# Patient Record
Sex: Female | Born: 1970 | Race: White | Hispanic: No | Marital: Single | Smoking: Former smoker
Health system: Southern US, Community
[De-identification: ages and names within clinical notes are randomized; demographics above are authoritative.]

## PROBLEM LIST (undated history)

## (undated) DIAGNOSIS — B009 Herpesviral infection, unspecified: Secondary | ICD-10-CM

## (undated) DIAGNOSIS — K219 Gastro-esophageal reflux disease without esophagitis: Secondary | ICD-10-CM

## (undated) DIAGNOSIS — L309 Dermatitis, unspecified: Secondary | ICD-10-CM

## (undated) DIAGNOSIS — N809 Endometriosis, unspecified: Secondary | ICD-10-CM

## (undated) HISTORY — PX: HERNIA REPAIR: SHX51

## (undated) HISTORY — PX: BREAST ENHANCEMENT SURGERY: SHX7

## (undated) HISTORY — PX: OTHER SURGICAL HISTORY: SHX169

## (undated) HISTORY — DX: Gastro-esophageal reflux disease without esophagitis: K21.9

## (undated) HISTORY — DX: Endometriosis, unspecified: N80.9

## (undated) HISTORY — PX: BREAST BIOPSY: SHX20

---

## 1998-08-06 ENCOUNTER — Other Ambulatory Visit: Admission: RE | Admit: 1998-08-06 | Discharge: 1998-08-06 | Payer: Self-pay | Admitting: Obstetrics and Gynecology

## 1999-08-12 ENCOUNTER — Other Ambulatory Visit: Admission: RE | Admit: 1999-08-12 | Discharge: 1999-08-12 | Payer: Self-pay | Admitting: Obstetrics and Gynecology

## 2000-08-14 ENCOUNTER — Other Ambulatory Visit: Admission: RE | Admit: 2000-08-14 | Discharge: 2000-08-14 | Payer: Self-pay | Admitting: Obstetrics and Gynecology

## 2001-08-23 ENCOUNTER — Other Ambulatory Visit: Admission: RE | Admit: 2001-08-23 | Discharge: 2001-08-23 | Payer: Self-pay | Admitting: Obstetrics and Gynecology

## 2002-09-05 ENCOUNTER — Other Ambulatory Visit: Admission: RE | Admit: 2002-09-05 | Discharge: 2002-09-05 | Payer: Self-pay | Admitting: Obstetrics and Gynecology

## 2003-09-10 ENCOUNTER — Other Ambulatory Visit: Admission: RE | Admit: 2003-09-10 | Discharge: 2003-09-10 | Payer: Self-pay | Admitting: Obstetrics and Gynecology

## 2004-07-15 ENCOUNTER — Ambulatory Visit (HOSPITAL_BASED_OUTPATIENT_CLINIC_OR_DEPARTMENT_OTHER): Admission: RE | Admit: 2004-07-15 | Discharge: 2004-07-15 | Payer: Self-pay | Admitting: General Surgery

## 2004-07-15 ENCOUNTER — Ambulatory Visit (HOSPITAL_COMMUNITY): Admission: RE | Admit: 2004-07-15 | Discharge: 2004-07-15 | Payer: Self-pay | Admitting: General Surgery

## 2004-07-15 ENCOUNTER — Encounter (INDEPENDENT_AMBULATORY_CARE_PROVIDER_SITE_OTHER): Payer: Self-pay | Admitting: *Deleted

## 2004-10-12 ENCOUNTER — Other Ambulatory Visit: Admission: RE | Admit: 2004-10-12 | Discharge: 2004-10-12 | Payer: Self-pay | Admitting: Obstetrics and Gynecology

## 2005-10-20 ENCOUNTER — Other Ambulatory Visit: Admission: RE | Admit: 2005-10-20 | Discharge: 2005-10-20 | Payer: Self-pay | Admitting: Obstetrics and Gynecology

## 2006-08-10 ENCOUNTER — Emergency Department (HOSPITAL_COMMUNITY): Admission: EM | Admit: 2006-08-10 | Discharge: 2006-08-10 | Payer: Self-pay | Admitting: Family Medicine

## 2008-12-19 HISTORY — PX: AUGMENTATION MAMMAPLASTY: SUR837

## 2009-01-09 ENCOUNTER — Encounter: Admission: RE | Admit: 2009-01-09 | Discharge: 2009-01-09 | Payer: Self-pay | Admitting: Obstetrics and Gynecology

## 2010-05-27 ENCOUNTER — Ambulatory Visit: Payer: Self-pay | Admitting: Internal Medicine

## 2010-05-27 DIAGNOSIS — R0989 Other specified symptoms and signs involving the circulatory and respiratory systems: Secondary | ICD-10-CM

## 2010-05-27 DIAGNOSIS — R0609 Other forms of dyspnea: Secondary | ICD-10-CM

## 2010-05-27 DIAGNOSIS — K219 Gastro-esophageal reflux disease without esophagitis: Secondary | ICD-10-CM | POA: Insufficient documentation

## 2010-05-27 DIAGNOSIS — R079 Chest pain, unspecified: Secondary | ICD-10-CM | POA: Insufficient documentation

## 2010-05-28 ENCOUNTER — Telehealth (INDEPENDENT_AMBULATORY_CARE_PROVIDER_SITE_OTHER): Payer: Self-pay | Admitting: *Deleted

## 2011-01-20 NOTE — Assessment & Plan Note (Signed)
Summary: new to estab/cbs   Vital Signs:  Patient profile:   40 year old female Height:      63.5 inches Weight:      106 pounds BMI:     18.55 O2 Sat:      100 % Temp:     98.0 degrees F oral Pulse rate:   95 / minute Resp:     14 per minute BP sitting:   102 / 64  (left arm) Cuff size:   regular  Vitals Entered By: Shonna Chock (May 27, 2010 1:14 PM) CC: New to Establish- since October/November 2010 patient feels like something is going on in her chest-" Kinda like SOB"  Comments No current prescribed meds   CC:  New to Establish- since October/November 2010 patient feels like something is going on in her chest-" Kinda like SOB" .  History of Present Illness:        Ann Mckenzie is here to establish as a new patient;she has had dyspnea on exertion & "chest tightness"   for 6 months. She is concerned as her father, MGF , MGM  & 2 Muncles had lung cancer.All were smokers; she has smoked since 16, up to 1 ppd. She was  Rxed Chantix  by Dr Arelia Sneddon  which  did help some,but it caused constipation.       The patient reports resting chest pain as well as exertional chest pain, and shortness of breath("as if I can't get a deep enough breath"), but denies nausea, vomiting, diaphoresis, palpitations, dizziness, light headedness, syncope, and indigestion ( dyspepsia is separate  issue controlled by OTC Prilosec).  The pain is described as intermittent and pressure-like.  The pain is located in the L chest; the pain does not radiate.  Episodes of chest pain last 2-5 minutes.  The pain is brought on or made worse by strenuous activity.  Lipids were  WNL @ Dr Lisbeth Ply office.  Preventive Screening-Counseling & Management  Alcohol-Tobacco     Smoking Status: current  Caffeine-Diet-Exercise     Does Patient Exercise: yes  Allergies (verified): No Known Drug Allergies  Past History:  Past Medical History: Endometriosis GERD  Past Surgical History: Breast implants; Dental implant; G 1 P 1 ; C  section  Family History: Father: lung cancer Mother: neg Siblings: none; MGM , MGF, 2 M uncles lung cancer  Social History: Occupation:Administrative  Married Current Smoker: 1 ppd Alcohol use-yes: socially Regular exercise-yes: CVE( P90X ) 3X /week  for 45 min Smoking Status:  current Does Patient Exercise:  yes  Review of Systems General:  Denies fatigue and weight loss. ENT:  Denies difficulty swallowing, hoarseness, nasal congestion, and sinus pressure. CV:  Denies difficulty breathing at night, difficulty breathing while lying down, and leg cramps with exertion. Resp:  Denies chest pain with inspiration, coughing up blood, excessive snoring, hypersomnolence, morning headaches, pleuritic, and wheezing.  Physical Exam  General:  Thin but well-nourished,in no acute distress; alert,appropriate and cooperative throughout examination Eyes:  No corneal or conjunctival inflammation noted.Marland Kitchen Perrla.  Ears:  R ear normal.   Wax on L Nose:  External nasal examination shows no deformity or inflammation. Nasal mucosa are pink and moist without lesions or exudates. Mouth:  Oral mucosa and oropharynx without lesions or exudates.  Teeth in good repair. Neck:  No deformities, masses, or tenderness noted. Lungs:  Normal respiratory effort, chest expands symmetrically. Lungs are clear to auscultation, no crackles or wheezes. Heart:  Normal rate and regular rhythm.  S1 and S2 normal without gallop, murmur, click, rub .S4 with slurring Abdomen:  Bowel sounds positive,abdomen soft and non-tender without masses, organomegaly or hernias noted. Aortic bruit with slurring ; no AAA Pulses:  R and L carotid,radial  and posterior tibial pulses are full and equal bilaterally. Slightly decreased DPP Extremities:  No clubbing, cyanosis, edema. Neurologic:  alert & oriented X3 and DTRs symmetrical and normal.   Skin:  Intact without suspicious lesions or rashes. Tanned Cervical Nodes:  Minor shotty  LA Axillary Nodes:  No palpable lymphadenopathy Psych:  memory intact for recent and remote, normally interactive, good eye contact, not anxious appearing, and not depressed appearing.     Impression & Recommendations:  Problem # 1:  CHEST PAIN (ICD-786.50)  Orders: EKG w/ Interpretation (93000)  Problem # 2:  DYSPNEA/SHORTNESS OF BREATH (ICD-786.09)  Orders: T-2 View CXR (71020TC) Misc. Referral (Misc. Ref)  Problem # 3:  GERD (ICD-530.81)  Problem # 4:  LUNG CANCER, FAMILY HX (ICD-V16.1)  Patient Instructions: 1)  Please review the diagrams concerning genetic  lung cancer risk & Nicotine driven Dopamine release  as discussed. Consider all smoking cessation options as discussed.

## 2011-01-20 NOTE — Progress Notes (Signed)
Summary: xray results  Phone Note Call from Patient Call back at Work Phone [phone]   Caller: Patient Summary of Call: pt called wanted results of xray, --given and informed was mailed .Kandice Hams  May 28, 2010 3:04 PM  Initial call taken by: Kandice Hams,  May 28, 2010 3:04 PM

## 2011-02-16 ENCOUNTER — Other Ambulatory Visit: Payer: Self-pay | Admitting: Obstetrics and Gynecology

## 2011-02-16 DIAGNOSIS — R928 Other abnormal and inconclusive findings on diagnostic imaging of breast: Secondary | ICD-10-CM

## 2011-02-22 ENCOUNTER — Ambulatory Visit
Admission: RE | Admit: 2011-02-22 | Discharge: 2011-02-22 | Disposition: A | Discharge status: Home / Self Care | Payer: 59 | Source: Ambulatory Visit | Attending: Obstetrics and Gynecology | Admitting: Obstetrics and Gynecology

## 2011-02-22 ENCOUNTER — Other Ambulatory Visit: Payer: Self-pay | Admitting: Obstetrics and Gynecology

## 2011-02-22 DIAGNOSIS — R928 Other abnormal and inconclusive findings on diagnostic imaging of breast: Secondary | ICD-10-CM

## 2011-05-06 NOTE — Op Note (Signed)
Ann Mckenzie, Ann Mckenzie                          ACCOUNT NO.:  [id number]   MEDICAL RECORD NO.:  [id number]                   PATIENT TYPE:  AMB   LOCATION:  [facility]                                  FACILITY:  [facility]   PHYSICIAN:  Ollen Gross. Vernell Morgans, M.D.              DATE OF BIRTH:  1971/06/04   DATE OF PROCEDURE:  07/15/2004  DATE OF DISCHARGE:  07/15/2004                                 OPERATIVE REPORT   PREOPERATIVE DIAGNOSIS:  Right inguinal hernia.   POSTOPERATIVE DIAGNOSIS:  Right indirect inguinal hernia.   OPERATION PERFORMED:  Right inguinal hernia repair with mesh.   SURGEON:  Ollen Gross. Carolynne Edouard, M.D.   ANESTHESIA:  General via LMA.   DESCRIPTION OF PROCEDURE:  After informed consent was obtained, the patient  was brought to the operating room and placed in supine position on the  operating table.  After adequate induction of general anesthesia, the  patient's right abdomen and groin were prepped with Betadine and draped in  the usual sterile manner.  A small incision was made from the edge of the  pubic tubercle on the right towards the anterior __________ for a distance  of about 4 to 5 cm.  This incision was carried down through the skin and  subcutaneous tissue sharply with the electrocautery.  A small bridging vein  was encountered which was clamped with hemostats, divided and ligated with 3-  0 silk ties.  Dissection was then carried through the subcutaneous tissue  until the fascia of the external oblique was encountered.  This fascial  layer was opened along its fibers towards the apex of the external ring  sharply with the Metzenbaum scissors.  Care was taken to avoid the  ilioinguinal nerve.  The ilioinguinal nerve was very adherent to the cord  with some scar tissue and so the nerve was clamped with hemostats both  proximally and distally, divided and then ligated with 3-0 silk ties.  The  broad ligament structures and the inguinal canal were then gently  skeletonized.   A small hernia sac was identified in this ligamentous  structure and was gently teased away from the rest of this broad ligament by  combination of blunt hemostat dissection as well as sharp dissection with  electrocautery.  The sac was then opened and there were no visceral contents  within the sac.  The sac was ligated near its base with a 2-0 silk suture  ligature.  The distal portion of the sac was excised and sent to pathology  for identification and the proximal sac was allowed to retract back beneath  the muscular aponeurotic transversalis layer.  The rest of the broad  ligament structures were clamped proximally and distally with hemostats and  divided and ligated with 3-0 silk ties.  The patient was very thin and it  was decided to use a piece of Parietex polyester mesh so that she  would not  be able to feel the hard mesh through her very thin abdominal wall.  This  mesh was cut to fit to the floor of the inguinal canal.  The mesh was then  sewed inferiorly to the shelving edge of the inguinal ligament using a  running 3-0 Prolene stitch.  The mesh was then sewed superiorly to the  muscular aponeurotic fascial layer of the transversalis with 3-0 Prolene  interrupted vertical mattress stitches.  Once this was accomplished, the  mesh appeared to be in good position without any tension.  The wound was  then irrigated with copious amounts of saline.  The external oblique was  then reapproximated with 3-0 Vicryl stitch. The subcutaneous tissue was  irrigated with copious amounts of saline.  The subcutaneous fascia was then  closed with running 3-0 Vicryl stitch and the skin was  closed with a running 4-0 Monocryl subcuticular stitch.  Benzoin, Steri-  Strips and sterile dressings were applied.  The patient tolerated the  procedure well.  At the end of the case all sponge, needle and instrument  counts were correct.  The patient was awakened and taken to the recovery  room in stable  condition.                                               Ollen Gross. Vernell Morgans, M.D.    PST/MEDQ  D:  07/18/2004  T:  07/18/2004  Job:  [id number]

## 2012-07-18 ENCOUNTER — Other Ambulatory Visit (HOSPITAL_COMMUNITY): Payer: Self-pay | Admitting: Obstetrics and Gynecology

## 2012-07-18 DIAGNOSIS — K8020 Calculus of gallbladder without cholecystitis without obstruction: Secondary | ICD-10-CM

## 2012-07-18 DIAGNOSIS — R1011 Right upper quadrant pain: Secondary | ICD-10-CM

## 2012-07-23 ENCOUNTER — Ambulatory Visit (HOSPITAL_COMMUNITY)
Admission: RE | Admit: 2012-07-23 | Discharge: 2012-07-23 | Disposition: A | Discharge status: Home / Self Care | Payer: BC Managed Care – PPO | Source: Ambulatory Visit | Attending: Obstetrics and Gynecology | Admitting: Obstetrics and Gynecology

## 2012-07-23 DIAGNOSIS — R1011 Right upper quadrant pain: Secondary | ICD-10-CM | POA: Insufficient documentation

## 2012-07-23 DIAGNOSIS — K8020 Calculus of gallbladder without cholecystitis without obstruction: Secondary | ICD-10-CM

## 2012-07-31 ENCOUNTER — Encounter: Payer: Self-pay | Admitting: Gastroenterology

## 2012-08-02 ENCOUNTER — Ambulatory Visit: Payer: 59 | Admitting: Gastroenterology

## 2013-07-10 ENCOUNTER — Other Ambulatory Visit: Payer: Self-pay | Admitting: Obstetrics and Gynecology

## 2013-07-10 DIAGNOSIS — R928 Other abnormal and inconclusive findings on diagnostic imaging of breast: Secondary | ICD-10-CM

## 2013-07-24 ENCOUNTER — Ambulatory Visit
Admission: RE | Admit: 2013-07-24 | Discharge: 2013-07-24 | Disposition: A | Discharge status: Home / Self Care | Payer: BC Managed Care – PPO | Source: Ambulatory Visit | Attending: Obstetrics and Gynecology | Admitting: Obstetrics and Gynecology

## 2013-07-24 DIAGNOSIS — R928 Other abnormal and inconclusive findings on diagnostic imaging of breast: Secondary | ICD-10-CM

## 2017-09-05 ENCOUNTER — Other Ambulatory Visit: Payer: Self-pay | Admitting: Obstetrics and Gynecology

## 2017-09-05 DIAGNOSIS — R921 Mammographic calcification found on diagnostic imaging of breast: Secondary | ICD-10-CM

## 2017-09-15 ENCOUNTER — Ambulatory Visit
Admission: RE | Admit: 2017-09-15 | Discharge: 2017-09-15 | Disposition: A | Discharge status: Home / Self Care | Payer: BLUE CROSS/BLUE SHIELD | Source: Ambulatory Visit | Attending: Obstetrics and Gynecology | Admitting: Obstetrics and Gynecology

## 2017-09-15 ENCOUNTER — Other Ambulatory Visit: Payer: Self-pay | Admitting: Obstetrics and Gynecology

## 2017-09-15 DIAGNOSIS — R921 Mammographic calcification found on diagnostic imaging of breast: Secondary | ICD-10-CM

## 2017-09-22 ENCOUNTER — Other Ambulatory Visit: Payer: Self-pay | Admitting: Obstetrics and Gynecology

## 2017-09-22 ENCOUNTER — Ambulatory Visit
Admission: RE | Admit: 2017-09-22 | Discharge: 2017-09-22 | Disposition: A | Discharge status: Home / Self Care | Payer: BLUE CROSS/BLUE SHIELD | Source: Ambulatory Visit | Attending: Obstetrics and Gynecology | Admitting: Obstetrics and Gynecology

## 2017-09-22 DIAGNOSIS — R921 Mammographic calcification found on diagnostic imaging of breast: Secondary | ICD-10-CM

## 2017-10-02 HISTORY — PX: BREAST BIOPSY: SHX20

## 2018-11-30 ENCOUNTER — Other Ambulatory Visit: Payer: Self-pay | Admitting: Obstetrics and Gynecology

## 2018-11-30 DIAGNOSIS — Z1231 Encounter for screening mammogram for malignant neoplasm of breast: Secondary | ICD-10-CM

## 2018-12-19 DIAGNOSIS — Z923 Personal history of irradiation: Secondary | ICD-10-CM

## 2018-12-19 HISTORY — DX: Personal history of irradiation: Z92.3

## 2019-01-09 ENCOUNTER — Ambulatory Visit: Payer: BLUE CROSS/BLUE SHIELD

## 2019-02-07 ENCOUNTER — Ambulatory Visit
Admission: RE | Admit: 2019-02-07 | Discharge: 2019-02-07 | Disposition: A | Discharge status: Home / Self Care | Payer: BLUE CROSS/BLUE SHIELD | Source: Ambulatory Visit | Attending: Obstetrics and Gynecology | Admitting: Obstetrics and Gynecology

## 2019-02-07 DIAGNOSIS — Z1231 Encounter for screening mammogram for malignant neoplasm of breast: Secondary | ICD-10-CM

## 2019-02-08 ENCOUNTER — Other Ambulatory Visit: Payer: Self-pay | Admitting: Obstetrics and Gynecology

## 2019-02-08 DIAGNOSIS — N63 Unspecified lump in unspecified breast: Secondary | ICD-10-CM

## 2019-02-20 ENCOUNTER — Ambulatory Visit
Admission: RE | Admit: 2019-02-20 | Discharge: 2019-02-20 | Disposition: A | Discharge status: Home / Self Care | Payer: BLUE CROSS/BLUE SHIELD | Source: Ambulatory Visit | Attending: Obstetrics and Gynecology | Admitting: Obstetrics and Gynecology

## 2019-02-20 ENCOUNTER — Other Ambulatory Visit: Payer: BLUE CROSS/BLUE SHIELD

## 2019-02-20 ENCOUNTER — Other Ambulatory Visit: Payer: Self-pay | Admitting: Obstetrics and Gynecology

## 2019-02-20 DIAGNOSIS — N63 Unspecified lump in unspecified breast: Secondary | ICD-10-CM

## 2019-02-20 DIAGNOSIS — N6310 Unspecified lump in the right breast, unspecified quadrant: Secondary | ICD-10-CM

## 2019-02-22 ENCOUNTER — Ambulatory Visit
Admission: RE | Admit: 2019-02-22 | Discharge: 2019-02-22 | Disposition: A | Discharge status: Home / Self Care | Payer: BLUE CROSS/BLUE SHIELD | Source: Ambulatory Visit | Attending: Obstetrics and Gynecology | Admitting: Obstetrics and Gynecology

## 2019-02-22 DIAGNOSIS — N6310 Unspecified lump in the right breast, unspecified quadrant: Secondary | ICD-10-CM

## 2019-02-22 HISTORY — PX: BREAST BIOPSY: SHX20

## 2019-03-01 ENCOUNTER — Encounter (HOSPITAL_BASED_OUTPATIENT_CLINIC_OR_DEPARTMENT_OTHER): Payer: Self-pay | Admitting: *Deleted

## 2019-03-01 ENCOUNTER — Other Ambulatory Visit: Payer: Self-pay

## 2019-03-01 ENCOUNTER — Other Ambulatory Visit: Payer: Self-pay | Admitting: Surgery

## 2019-03-01 DIAGNOSIS — Z853 Personal history of malignant neoplasm of breast: Secondary | ICD-10-CM

## 2019-03-04 ENCOUNTER — Encounter: Payer: Self-pay | Admitting: Oncology

## 2019-03-05 NOTE — H&P (Signed)
Ann Mckenzie Documented: 03/01/2019 9:59 AM Location: [facility]ck[facility] Patient #: [id number] DOB: 11/28/1971 Single / Language: Ann Mckenzie / Race: White Female   History of Present Illness (Ann Bencosme A. Ninfa Linden MD; 03/01/2019 10:40 AM) The patient is a 48 year old female who presents with breast cancer. This is a 48 year old female referred by Dr. Arnetha Gula for evaluation of a newly diagnosed right breast cancer. The patient actually felt a small mass in the right breast back in either November or October. She has a history of bilateral breast implants underneath the muscle. She denied nipple discharge. She had no pain. She underwent mammograms and ultrasound showing a 1 cm mass in the right breast at the 11 o'clock position 2 cm from the nipple. Ultrasound of the axilla was normal. She has no family history of breast cancer. She is otherwise healthy without complaints. 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 Ki-67 was 2%   Past Surgical History Ann Mckenzie, CMA; 03/01/2019 9:59 AM) Breast Augmentation  Bilateral. Breast Biopsy  Right. Cesarean Section - 1  Oral Mckenzie   Diagnostic Studies History Ann Mckenzie, Ann Mckenzie; 03/01/2019 9:59 AM) Colonoscopy  never Mammogram  within last year Pap Smear  1-5 years ago  Allergies Ann Mckenzie, Ann Mckenzie; 03/01/2019 10:00 AM) No Known Drug Allergies [03/01/2019]:  Medication History Ann Mckenzie, Ann Mckenzie; 03/01/2019 10:00 AM) Medications Reconciled  Social History Ann Mckenzie, CMA; 03/01/2019 9:59 AM) Alcohol use  Moderate alcohol use. Caffeine use  Carbonated beverages, Coffee. No drug use  Tobacco use  Former smoker.  Family History Ann Mckenzie, Ann Mckenzie; 03/01/2019 9:59 AM) Alcohol Abuse  Father.  Pregnancy / Birth History Ann Mckenzie, Ann Mckenzie; 03/01/2019 9:59 AM) Age at menarche  50 years. Age of menopause   28-50 Contraceptive History  Intrauterine device. Gravida  1 Irregular periods  Length (months) of breastfeeding  3-6 Maternal age  76-30 Para  1  Other Problems Ann Mckenzie, Ann Mckenzie; 03/01/2019 9:59 AM) Breast Cancer     Review of Systems Ann Mckenzie CMA; 03/01/2019 9:59 AM) General Not Present- Appetite Loss, Chills, Fatigue, Fever, Night Sweats, Weight Gain and Weight Loss. Skin Not Present- Change in Wart/Mole, Dryness, Hives, Jaundice, New Lesions, Non-Healing Wounds, Rash and Ulcer. HEENT Not Present- Earache, Hearing Loss, Hoarseness, Nose Bleed, Oral Ulcers, Ringing in the Ears, Seasonal Allergies, Sinus Pain, Sore Throat, Visual Disturbances, Wears glasses/contact lenses and Yellow Eyes. Respiratory Not Present- Bloody sputum, Chronic Cough, Difficulty Breathing, Snoring and Wheezing. Breast Present- Breast Mass. Not Present- Breast Pain, Nipple Discharge and Skin Changes. Cardiovascular Not Present- Chest Pain, Difficulty Breathing Lying Down, Leg Cramps, Palpitations, Rapid Heart Rate, Shortness of Breath and Swelling of Extremities. Gastrointestinal Not Present- Abdominal Pain, Bloating, Bloody Stool, Change in Bowel Habits, Chronic diarrhea, Constipation, Difficulty Swallowing, Excessive gas, Gets full quickly at meals, Hemorrhoids, Indigestion, Nausea, Rectal Pain and Vomiting. Female Genitourinary Not Present- Frequency, Nocturia, Painful Urination, Pelvic Pain and Urgency. Musculoskeletal Not Present- Back Pain, Joint Pain, Joint Stiffness, Muscle Pain, Muscle Weakness and Swelling of Extremities. Neurological Not Present- Decreased Memory, Fainting, Headaches, Numbness, Seizures, Tingling, Tremor, Trouble walking and Weakness. Psychiatric Not Present- Anxiety, Bipolar, Change in Sleep Pattern, Depression, Fearful and Frequent crying. Endocrine Not Present- Cold Intolerance, Excessive Hunger, Hair Changes, Heat Intolerance, Hot flashes and New Diabetes. Hematology  Not Present- Blood Thinners, Easy Bruising, Excessive bleeding, Gland problems, HIV and Persistent Infections.  Vitals Ann Mckenzie CMA; 03/01/2019 9:59 AM) 03/01/2019 9:59 AM Weight:  100.2 lb Height: 64in Body Surface Area: 1.46 m Body Mass Index: 17.2 kg/m  Temp.: 98.29F  Pulse: 81 (Regular)  BP: 106/74 (Sitting, Left Arm, Standard)       Physical Exam (Ann Sem A. Ninfa Linden MD; 03/01/2019 10:39 AM) General Mental Status-Alert. General Appearance-Consistent with stated age. Hydration-Well hydrated. Voice-Normal.  Head and Neck Head-normocephalic, atraumatic with no lesions or palpable masses. Trachea-midline. Thyroid Gland Characteristics - normal size and consistency.  Eye Eyeball - Bilateral-Extraocular movements intact. Sclera/Conjunctiva - Bilateral-No scleral icterus.  Chest and Lung Exam Chest and lung exam reveals -quiet, even and easy respiratory effort with no use of accessory muscles and on auscultation, normal breath sounds, no adventitious sounds and normal vocal resonance. Inspection Chest Wall - Normal. Back - normal.  Breast Breast - Left-Symmetric, Non Tender, No Biopsy scars, no Dimpling, No Inflammation, No Lumpectomy scars, No Mastectomy scars, No Peau d' Orange. Breast - Right-Symmetric, Non Tender, No Biopsy scars, no Dimpling, No Inflammation, No Lumpectomy scars, No Mastectomy scars, No Peau d' Orange. Note: There is a small mass palpable at the 11 o'clock position of the right breast near the areola. It feels fairly superficial. There is no axillary adenopathy on either side and no other breast masses.  Cardiovascular Cardiovascular examination reveals -normal heart sounds, regular rate and rhythm with no murmurs and normal pedal pulses bilaterally.  Abdomen Inspection Inspection of the abdomen reveals - No Hernias. Skin - Scar - no surgical scars. Palpation/Percussion Palpation and Percussion of the  abdomen reveal - Soft, Non Tender, No Rebound tenderness, No Rigidity (guarding) and No hepatosplenomegaly. Auscultation Auscultation of the abdomen reveals - Bowel sounds normal.  Neurologic - Did not examine.  Musculoskeletal - Did not examine.  Lymphatic Head & Neck  General Head & Neck Lymphatics: Bilateral - Description - Normal. Axillary  General Axillary Region: Bilateral - Description - Normal. Tenderness - Non Tender. Femoral & Inguinal - Did not examine.    Assessment & Plan (Uriel Horkey A. 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 I discussed the diagnosis with the patient and her husband. We reviewed the mammograms, ultrasound, and pathology results. I gave her a copy of the pathology. We discussed breast cancer in detail. We discussed breast conservation versus mastectomy. We discussed postoperative radiation. We discussed sentinel lymph node biopsies, etc. After long discussion, she wished to proceed with a right breast partial mastectomy and sentinel lymph node biopsy. I discussed the risks of Mckenzie which includes but is not limited to bleeding, infection, injury to the implant, the need for further Mckenzie if margins are nodes are positive, cardiopulmonary issues, postoperative recovery, etc. We will refer her to medical and radiation oncology for further evaluation as well.

## 2019-03-05 NOTE — Progress Notes (Addendum)
Ensure pre surgery drink given with instructions to complete by 0530 dos, pt verbalized understanding. 

## 2019-03-06 ENCOUNTER — Other Ambulatory Visit: Payer: Self-pay

## 2019-03-06 ENCOUNTER — Ambulatory Visit (HOSPITAL_BASED_OUTPATIENT_CLINIC_OR_DEPARTMENT_OTHER): Payer: BLUE CROSS/BLUE SHIELD | Admitting: Anesthesiology

## 2019-03-06 ENCOUNTER — Ambulatory Visit (HOSPITAL_COMMUNITY)
Admission: RE | Admit: 2019-03-06 | Discharge: 2019-03-06 | Disposition: A | Discharge status: Home / Self Care | Payer: BLUE CROSS/BLUE SHIELD | Source: Ambulatory Visit | Attending: Surgery | Admitting: Surgery

## 2019-03-06 ENCOUNTER — Encounter (HOSPITAL_BASED_OUTPATIENT_CLINIC_OR_DEPARTMENT_OTHER): Payer: Self-pay | Admitting: *Deleted

## 2019-03-06 ENCOUNTER — Encounter (HOSPITAL_BASED_OUTPATIENT_CLINIC_OR_DEPARTMENT_OTHER)
Admission: RE | Disposition: A | Discharge status: Home / Self Care | Payer: Self-pay | Source: Home / Self Care | Attending: Surgery

## 2019-03-06 ENCOUNTER — Ambulatory Visit (HOSPITAL_BASED_OUTPATIENT_CLINIC_OR_DEPARTMENT_OTHER)
Admission: RE | Admit: 2019-03-06 | Discharge: 2019-03-06 | Disposition: A | Discharge status: Home / Self Care | Payer: BLUE CROSS/BLUE SHIELD | Attending: Surgery | Admitting: Surgery

## 2019-03-06 DIAGNOSIS — Z170 Estrogen receptor positive status [ER+]: Secondary | ICD-10-CM | POA: Insufficient documentation

## 2019-03-06 DIAGNOSIS — Z87891 Personal history of nicotine dependence: Secondary | ICD-10-CM | POA: Diagnosis not present

## 2019-03-06 DIAGNOSIS — C50411 Malignant neoplasm of upper-outer quadrant of right female breast: Secondary | ICD-10-CM | POA: Diagnosis present

## 2019-03-06 DIAGNOSIS — Z853 Personal history of malignant neoplasm of breast: Secondary | ICD-10-CM

## 2019-03-06 DIAGNOSIS — C50919 Malignant neoplasm of unspecified site of unspecified female breast: Secondary | ICD-10-CM

## 2019-03-06 HISTORY — DX: Malignant neoplasm of unspecified site of unspecified female breast: C50.919

## 2019-03-06 HISTORY — PX: PARTIAL MASTECTOMY WITH AXILLARY SENTINEL LYMPH NODE BIOPSY: SHX6004

## 2019-03-06 HISTORY — DX: Herpesviral infection, unspecified: B00.9

## 2019-03-06 HISTORY — DX: Dermatitis, unspecified: L30.9

## 2019-03-06 HISTORY — PX: BREAST LUMPECTOMY: SHX2

## 2019-03-06 SURGERY — PARTIAL MASTECTOMY WITH AXILLARY SENTINEL LYMPH NODE BIOPSY
Anesthesia: General | Site: Breast | Laterality: Right

## 2019-03-06 MED ORDER — CHLORHEXIDINE GLUCONATE CLOTH 2 % EX PADS: 6.0000 | MEDICATED_PAD | Freq: Once | CUTANEOUS | Status: DC

## 2019-03-06 MED ORDER — PROMETHAZINE HCL 25 MG/ML IJ SOLN: 6.2500 mg | INTRAMUSCULAR | Status: DC | PRN

## 2019-03-06 MED ORDER — ACETAMINOPHEN 500 MG PO TABS
1000.0000 mg | ORAL_TABLET | ORAL | Status: AC
  Administered 2019-03-06: 1000 mg via ORAL

## 2019-03-06 MED ORDER — MIDAZOLAM HCL 2 MG/2ML IJ SOLN
1.0000 mg | INTRAMUSCULAR | Status: DC | PRN
  Administered 2019-03-06: 2 mg via INTRAVENOUS

## 2019-03-06 MED ORDER — SCOPOLAMINE 1 MG/3DAYS TD PT72
MEDICATED_PATCH | TRANSDERMAL | Status: AC
  Filled 2019-03-06: qty 1

## 2019-03-06 MED ORDER — ACETAMINOPHEN 10 MG/ML IV SOLN: 1000.0000 mg | Freq: Once | INTRAVENOUS | Status: DC | PRN

## 2019-03-06 MED ORDER — LIDOCAINE HCL (CARDIAC) PF 100 MG/5ML IV SOSY
PREFILLED_SYRINGE | INTRAVENOUS | Status: DC | PRN
  Administered 2019-03-06: 60 mg via INTRAVENOUS

## 2019-03-06 MED ORDER — ONDANSETRON HCL 4 MG/2ML IJ SOLN
INTRAMUSCULAR | Status: DC | PRN
  Administered 2019-03-06: 4 mg via INTRAVENOUS

## 2019-03-06 MED ORDER — CEFAZOLIN SODIUM-DEXTROSE 2-4 GM/100ML-% IV SOLN
2.0000 g | INTRAVENOUS | Status: AC
  Administered 2019-03-06: 2 g via INTRAVENOUS

## 2019-03-06 MED ORDER — LACTATED RINGERS IV SOLN: INTRAVENOUS | Status: DC

## 2019-03-06 MED ORDER — MIDAZOLAM HCL 2 MG/2ML IJ SOLN
INTRAMUSCULAR | Status: AC
  Filled 2019-03-06: qty 2

## 2019-03-06 MED ORDER — MEPERIDINE HCL 25 MG/ML IJ SOLN: 6.2500 mg | INTRAMUSCULAR | Status: DC | PRN

## 2019-03-06 MED ORDER — FENTANYL CITRATE (PF) 100 MCG/2ML IJ SOLN
50.0000 ug | INTRAMUSCULAR | Status: DC | PRN
  Administered 2019-03-06: 100 ug via INTRAVENOUS

## 2019-03-06 MED ORDER — GABAPENTIN 300 MG PO CAPS
300.0000 mg | ORAL_CAPSULE | ORAL | Status: AC
  Administered 2019-03-06: 300 mg via ORAL

## 2019-03-06 MED ORDER — ACETAMINOPHEN 500 MG PO TABS
ORAL_TABLET | ORAL | Status: AC
  Filled 2019-03-06: qty 2

## 2019-03-06 MED ORDER — BUPIVACAINE-EPINEPHRINE 0.25% -1:200000 IJ SOLN
INTRAMUSCULAR | Status: DC | PRN
  Administered 2019-03-06: 10 mL

## 2019-03-06 MED ORDER — FENTANYL CITRATE (PF) 100 MCG/2ML IJ SOLN
INTRAMUSCULAR | Status: AC
  Filled 2019-03-06: qty 2

## 2019-03-06 MED ORDER — CEFAZOLIN SODIUM-DEXTROSE 2-4 GM/100ML-% IV SOLN
INTRAVENOUS | Status: AC
  Filled 2019-03-06: qty 100

## 2019-03-06 MED ORDER — ACETAMINOPHEN 160 MG/5ML PO SOLN: 325.0000 mg | Freq: Once | ORAL | Status: DC

## 2019-03-06 MED ORDER — LACTATED RINGERS IV SOLN
INTRAVENOUS | Status: DC
  Administered 2019-03-06: 08:00:00 via INTRAVENOUS

## 2019-03-06 MED ORDER — SCOPOLAMINE 1 MG/3DAYS TD PT72
1.0000 | MEDICATED_PATCH | Freq: Once | TRANSDERMAL | Status: DC | PRN
  Administered 2019-03-06: 1.5 mg via TRANSDERMAL

## 2019-03-06 MED ORDER — OXYCODONE HCL 5 MG PO TABS: 5.0000 mg | ORAL_TABLET | Freq: Four times a day (QID) | ORAL | 0 refills | Status: DC | PRN

## 2019-03-06 MED ORDER — TECHNETIUM TC 99M SULFUR COLLOID FILTERED
1.0000 | Freq: Once | INTRAVENOUS | Status: AC | PRN
  Administered 2019-03-06: 1 via INTRADERMAL

## 2019-03-06 MED ORDER — OXYCODONE HCL 5 MG/5ML PO SOLN: 5.0000 mg | Freq: Once | ORAL | Status: DC | PRN

## 2019-03-06 MED ORDER — PROPOFOL 10 MG/ML IV BOLUS
INTRAVENOUS | Status: DC | PRN
  Administered 2019-03-06: 200 mg via INTRAVENOUS

## 2019-03-06 MED ORDER — BUPIVACAINE-EPINEPHRINE (PF) 0.5% -1:200000 IJ SOLN
INTRAMUSCULAR | Status: DC | PRN
  Administered 2019-03-06: 25 mL

## 2019-03-06 MED ORDER — OXYCODONE HCL 5 MG PO TABS: 5.0000 mg | ORAL_TABLET | Freq: Once | ORAL | Status: DC | PRN

## 2019-03-06 MED ORDER — DEXAMETHASONE SODIUM PHOSPHATE 4 MG/ML IJ SOLN
INTRAMUSCULAR | Status: DC | PRN
  Administered 2019-03-06: 10 mg via INTRAVENOUS

## 2019-03-06 MED ORDER — GABAPENTIN 300 MG PO CAPS
ORAL_CAPSULE | ORAL | Status: AC
  Filled 2019-03-06: qty 1

## 2019-03-06 MED ORDER — FENTANYL CITRATE (PF) 100 MCG/2ML IJ SOLN: 25.0000 ug | INTRAMUSCULAR | Status: DC | PRN

## 2019-03-06 MED ORDER — CELECOXIB 200 MG PO CAPS
ORAL_CAPSULE | ORAL | Status: AC
  Filled 2019-03-06: qty 1

## 2019-03-06 MED ORDER — CELECOXIB 200 MG PO CAPS
200.0000 mg | ORAL_CAPSULE | ORAL | Status: AC
  Administered 2019-03-06: 200 mg via ORAL

## 2019-03-06 MED ORDER — EPHEDRINE SULFATE 50 MG/ML IJ SOLN
INTRAMUSCULAR | Status: DC | PRN
  Administered 2019-03-06: 10 mg via INTRAVENOUS

## 2019-03-06 MED ORDER — ACETAMINOPHEN 325 MG PO TABS: 325.0000 mg | ORAL_TABLET | Freq: Once | ORAL | Status: DC

## 2019-03-06 SURGICAL SUPPLY — 61 items
ADH SKN CLS APL DERMABOND .7 (GAUZE/BANDAGES/DRESSINGS) ×1
APL PRP STRL LF DISP 70% ISPRP (MISCELLANEOUS) ×1
APPLIER CLIP 9.375 MED OPEN (MISCELLANEOUS) ×3
APR CLP MED 9.3 20 MLT OPN (MISCELLANEOUS) ×1
BINDER BREAST MEDIUM (GAUZE/BANDAGES/DRESSINGS) ×2 IMPLANT
BLADE CLIPPER SURG (BLADE) IMPLANT
BLADE HEX COATED 2.75 (ELECTRODE) ×3 IMPLANT
BLADE SURG 15 STRL LF DISP TIS (BLADE) ×2 IMPLANT
BLADE SURG 15 STRL SS (BLADE) ×3
CANISTER SUCT 1200ML W/VALVE (MISCELLANEOUS) IMPLANT
CHLORAPREP W/TINT 26 (MISCELLANEOUS) ×3 IMPLANT
CLIP APPLIE 9.375 MED OPEN (MISCELLANEOUS) IMPLANT
COVER BACK TABLE 60X90IN (DRAPES) ×4 IMPLANT
COVER MAYO STAND STRL (DRAPES) ×3 IMPLANT
COVER PROBE W GEL 5X96 (DRAPES) ×3 IMPLANT
COVER WAND RF STERILE (DRAPES) IMPLANT
DECANTER SPIKE VIAL GLASS SM (MISCELLANEOUS) IMPLANT
DERMABOND ADVANCED (GAUZE/BANDAGES/DRESSINGS) ×2
DERMABOND ADVANCED .7 DNX12 (GAUZE/BANDAGES/DRESSINGS) ×1 IMPLANT
DRAIN CHANNEL 19F RND (DRAIN) IMPLANT
DRAIN HEMOVAC 1/8 X 5 (WOUND CARE) IMPLANT
DRAPE LAPAROSCOPIC ABDOMINAL (DRAPES) ×3 IMPLANT
DRAPE UTILITY XL STRL (DRAPES) ×3 IMPLANT
ELECT REM PT RETURN 9FT ADLT (ELECTROSURGICAL) ×3
ELECTRODE REM PT RTRN 9FT ADLT (ELECTROSURGICAL) ×1 IMPLANT
EVACUATOR SILICONE 100CC (DRAIN) IMPLANT
GAUZE SPONGE 4X4 12PLY STRL LF (GAUZE/BANDAGES/DRESSINGS) IMPLANT
GLOVE BIOGEL PI IND STRL 7.0 (GLOVE) IMPLANT
GLOVE BIOGEL PI IND STRL 8 (GLOVE) IMPLANT
GLOVE BIOGEL PI INDICATOR 7.0 (GLOVE) ×2
GLOVE BIOGEL PI INDICATOR 8 (GLOVE) ×2
GLOVE SURG SIGNA 7.5 PF LTX (GLOVE) ×3 IMPLANT
GLOVE SURG SS PI 7.0 STRL IVOR (GLOVE) ×2 IMPLANT
GOWN STRL REUS W/ TWL LRG LVL3 (GOWN DISPOSABLE) ×1 IMPLANT
GOWN STRL REUS W/ TWL XL LVL3 (GOWN DISPOSABLE) ×1 IMPLANT
GOWN STRL REUS W/TWL LRG LVL3 (GOWN DISPOSABLE) ×3
GOWN STRL REUS W/TWL XL LVL3 (GOWN DISPOSABLE) ×3
HEMOSTAT SURGICEL 2X14 (HEMOSTASIS) IMPLANT
KIT MARKER MARGIN INK (KITS) ×3 IMPLANT
NDL HYPO 25X1 1.5 SAFETY (NEEDLE) ×2 IMPLANT
NDL SAFETY ECLIPSE 18X1.5 (NEEDLE) ×1 IMPLANT
NEEDLE HYPO 18GX1.5 SHARP (NEEDLE)
NEEDLE HYPO 25X1 1.5 SAFETY (NEEDLE) ×3 IMPLANT
NS IRRIG 1000ML POUR BTL (IV SOLUTION) ×2 IMPLANT
PACK BASIN DAY SURGERY FS (CUSTOM PROCEDURE TRAY) ×3 IMPLANT
PENCIL BUTTON HOLSTER BLD 10FT (ELECTRODE) ×3 IMPLANT
PIN SAFETY STERILE (MISCELLANEOUS) IMPLANT
SLEEVE SCD COMPRESS KNEE MED (MISCELLANEOUS) ×3 IMPLANT
SPONGE LAP 18X18 RF (DISPOSABLE) IMPLANT
SPONGE LAP 4X18 RFD (DISPOSABLE) ×3 IMPLANT
SUT ETHILON 3 0 PS 1 (SUTURE) IMPLANT
SUT MNCRL AB 4-0 PS2 18 (SUTURE) ×4 IMPLANT
SUT VIC AB 3-0 SH 27 (SUTURE) ×3
SUT VIC AB 3-0 SH 27X BRD (SUTURE) ×1 IMPLANT
SYR BULB 3OZ (MISCELLANEOUS) IMPLANT
SYR CONTROL 10ML LL (SYRINGE) ×4 IMPLANT
TOWEL GREEN STERILE FF (TOWEL DISPOSABLE) ×3 IMPLANT
TRAY FAXITRON CT DISP (TRAY / TRAY PROCEDURE) IMPLANT
TUBE CONNECTING 20'X1/4 (TUBING) ×1
TUBE CONNECTING 20X1/4 (TUBING) ×2 IMPLANT
YANKAUER SUCT BULB TIP NO VENT (SUCTIONS) ×2 IMPLANT

## 2019-03-06 NOTE — Anesthesia Procedure Notes (Addendum)
Procedure Name: LMA Insertion Date/Time: 03/06/2019 9:41 AM Performed by: Maryella Shivers, CRNA Pre-anesthesia Checklist: Patient identified, Emergency Drugs available, Suction available and Patient being monitored Patient Re-evaluated:Patient Re-evaluated prior to induction Oxygen Delivery Method: Circle system utilized Preoxygenation: Pre-oxygenation with 100% oxygen Induction Type: IV induction Ventilation: Mask ventilation without difficulty LMA: LMA inserted LMA Size: 3.0 Number of attempts: 1 Airway Equipment and Method: Bite block Placement Confirmation: positive ETCO2 Tube secured with: Tape Dental Injury: Teeth and Oropharynx as per pre-operative assessment

## 2019-03-06 NOTE — Progress Notes (Signed)
Assisted Dr. Kierah Goatley Robert with right, ultrasound guided, pectoralis block. Side rails up, monitors on throughout procedure. See vital signs in flow sheet. Tolerated Procedure well.

## 2019-03-06 NOTE — Anesthesia Postprocedure Evaluation (Signed)
Anesthesia Post Note  Patient: Ann Mckenzie  Procedure(s) Performed: RIGHT BREAST PARTIAL MASTECTOMY WITH SENTINEL LYMPH NODE BIOPSY (Right Breast)     Patient location during evaluation: PACU Anesthesia Type: General Level of consciousness: awake and alert Pain management: pain level controlled Vital Signs Assessment: post-procedure vital signs reviewed and stable Respiratory status: spontaneous breathing, nonlabored ventilation, respiratory function stable and patient connected to nasal cannula oxygen Cardiovascular status: blood pressure returned to baseline and stable Postop Assessment: no apparent nausea or vomiting Anesthetic complications: no    Last Vitals:  Vitals:   03/06/19 1043 03/06/19 1045  BP: 110/74 111/73  Pulse: 83 80  Resp: 10 13  Temp: 36.5 C   SpO2: 100% 100%    Last Pain:  Vitals:   03/06/19 1043  TempSrc:   PainSc: 0-No pain                 Effie Berkshire

## 2019-03-06 NOTE — Interval H&P Note (Signed)
History and Physical Interval Note:no change in H and P  03/06/2019 7:51 AM  Ann Mckenzie  has presented today for surgery, with the diagnosis of RIGHT BREAST CANCER.  The various methods of treatment have been discussed with the patient and family. After consideration of risks, benefits and other options for treatment, the patient has consented to  Procedure(s): RIGHT BREAST PARTIAL MASTECTOMY WITH SENTINEL LYMPH NODE BIOPSY (Right) as a surgical intervention.  The patient's history has been reviewed, patient examined, no change in status, stable for surgery.  I have reviewed the patient's chart and labs.  Questions were answered to the patient's satisfaction.     Coralie Keens

## 2019-03-06 NOTE — Anesthesia Procedure Notes (Signed)
Anesthesia Regional Block: Pectoralis block   Pre-Anesthetic Checklist: ,, timeout performed, Correct Patient, Correct Site, Correct Laterality, Correct Procedure, Correct Position, site marked, Risks and benefits discussed,  Surgical consent,  Pre-op evaluation,  At surgeon's request and post-op pain management  Laterality: Right  Prep: chloraprep       Needles:  Injection technique: Single-shot  Needle Type: Echogenic Stimulator Needle     Needle Length: 9cm  Needle Gauge: 21     Additional Needles:   Procedures:,,,, ultrasound used (permanent image in chart),,,,  Narrative:  Start time: 03/06/2019 8:55 AM End time: 03/06/2019 9:03 AM Injection made incrementally with aspirations every 5 mL.  Performed by: Personally  Anesthesiologist: Effie Berkshire, MD  Additional Notes: Patient tolerated the procedure well. Local anesthetic introduced in an incremental fashion under minimal resistance after negative aspirations. No paresthesias were elicited. After completion of the procedure, no acute issues were identified and patient continued to be monitored by RN.

## 2019-03-06 NOTE — Op Note (Signed)
RIGHT BREAST PARTIAL MASTECTOMY WITH DEEP RIGHT AXILLARY SENTINEL LYMPH NODE BIOPSY  Procedure Note  Ann Mckenzie 03/06/2019   Pre-op Diagnosis: RIGHT BREAST CANCER     Post-op Diagnosis: same  Procedure(s): RIGHT BREAST PARTIAL MASTECTOMY WITH DEEP RIGHT AXILLARY SENTINEL LYMPH NODE BIOPSY  Surgeon(s): Coralie Keens, MD  Anesthesia: General  Staff:  Circulator: Humberto Seals, RN Relief Circulator: Renae Gloss, RN  Estimated Blood Loss: Minimal               Specimens: sent to path  Indications: This is a 48 year old female with a recently diagnosed right breast cancer.  She had a palpable mass at the 11 o'clock position of the right breast.  Stereotactic biopsy was performed showing an invasive ductal carcinoma which was 100% ER and PR positive.  The decision was made to proceed to the operating room for a partial mastectomy and sentinel lymph node biopsy  Procedure: The patient had the radioactive isotope injected by the radiation technologist into the right breast around the areola in the holding area.  She was then taken to the operating room.  She is placed upon on the operating table and general anesthesia was induced.  Her breast and axilla on the right side were then prepped and draped in usual sterile fashion.  I anesthetized the skin at the upper outer edge of the areola with Marcaine.  I then made a circumareolar incision with a scalpel.  I then dissected down to the breast tissue with electrocautery.  The patient was very thin and had a submuscular breast implant.  She had very little breast tissue.  I excised the palpable mass in its entirety in the upper outer quadrant going down to the underlying muscle taking it off of the pectoralis fascia.  Once the mass was completely removed, I marked all margins with paint and then sent to pathology for evaluation.  Hemostasis was achieved with the cautery.  Next the neoprobe was used to identify an area of increased uptake  in the right axilla.  I anesthetized skin and made a small incision with a scalpel.  I then dissected in the deep axillary tissue.  Identified a group of clustered lymph nodes which I grasped with an Allis clamp and excised with the cautery.  These were confirmed to be the sentinel lymph nodes with the neoprobe.  I then evaluated the nodal basin and found no other increased uptake of radioactive isotope.  No enlarged palpable nodes were identified.  At this point I anesthetized the incisions further.  I placed surgical clips at the biopsy cavity on the breast.  I then closed the subcutaneous tissue with interrupted 3-0 Vicryl sutures and closed skin with running 4-0 Monocryl.  Dermabond was then applied.  The patient did have a small area of burn to the nipple which was covered with Neosporin.  Gauze and a binder were then applied.  The patient tolerated the procedure well.  All the counts were correct at the end of the procedure.  The patient was then extubated in the operating room and taken in a stable condition to the recovery room.          Coralie Keens   Date: 03/06/2019  Time: 10:36 AM

## 2019-03-06 NOTE — Transfer of Care (Signed)
Immediate Anesthesia Transfer of Care Note  Patient: Ann Mckenzie  Procedure(s) Performed: RIGHT BREAST PARTIAL MASTECTOMY WITH SENTINEL LYMPH NODE BIOPSY (Right Breast)  Patient Location: PACU  Anesthesia Type:GA combined with regional for post-op pain  Level of Consciousness: awake, alert  and oriented  Airway & Oxygen Therapy: Patient Spontanous Breathing and Patient connected to face mask oxygen  Post-op Assessment: Report given to RN and Post -op Vital signs reviewed and stable  Post vital signs: Reviewed and stable  Last Vitals:  Vitals Value Taken Time  BP 110/74 03/06/2019 10:43 AM  Temp    Pulse 86 03/06/2019 10:44 AM  Resp 16 03/06/2019 10:44 AM  SpO2 100 % 03/06/2019 10:44 AM  Vitals shown include unvalidated device data.  Last Pain:  Vitals:   03/06/19 0745  TempSrc: Oral  PainSc: 0-No pain      Patients Stated Pain Goal: 2 (66/06/00 4599)  Complications: No apparent anesthesia complications

## 2019-03-06 NOTE — Anesthesia Preprocedure Evaluation (Addendum)
Anesthesia Evaluation  Patient identified by MRN, date of birth, ID band Patient awake    Reviewed: Allergy & Precautions, NPO status , Patient's Chart, lab work & pertinent test results  Airway Mallampati: I  TM Distance: >3 FB Neck ROM: Full    Dental  (+) Teeth Intact, Dental Advisory Given   Pulmonary former smoker,    breath sounds clear to auscultation       Cardiovascular negative cardio ROS   Rhythm:Regular Rate:Normal     Neuro/Psych negative neurological ROS  negative psych ROS   GI/Hepatic Neg liver ROS, GERD  ,  Endo/Other  negative endocrine ROS  Renal/GU negative Renal ROS     Musculoskeletal negative musculoskeletal ROS (+)   Abdominal Normal abdominal exam  (+)   Peds  Hematology negative hematology ROS (+)   Anesthesia Other Findings   Reproductive/Obstetrics                            Anesthesia Physical Anesthesia Plan  ASA: II  Anesthesia Plan: General   Post-op Pain Management: GA combined w/ Regional for post-op pain   Induction: Intravenous  PONV Risk Score and Plan: 4 or greater and Ondansetron, Dexamethasone, Midazolam and Scopolamine patch - Pre-op  Airway Management Planned: LMA  Additional Equipment: None  Intra-op Plan:   Post-operative Plan: Extubation in OR  Informed Consent: I have reviewed the patients History and Physical, chart, labs and discussed the procedure including the risks, benefits and alternatives for the proposed anesthesia with the patient or authorized representative who has indicated his/her understanding and acceptance.     Dental advisory given  Plan Discussed with: CRNA  Anesthesia Plan Comments:        Anesthesia Quick Evaluation

## 2019-03-06 NOTE — Discharge Instructions (Signed)
[facility] Office Phone Number [phone]  BREAST BIOPSY/ PARTIAL LUMPECTOMY: POST OP INSTRUCTIONS  Always review your discharge instruction sheet given to you by the facility where your surgery was performed.  IF YOU HAVE DISABILITY OR FAMILY LEAVE FORMS, YOU MUST BRING THEM TO THE OFFICE FOR PROCESSING.  DO NOT GIVE THEM TO YOUR DOCTOR.  1. A prescription for pain medication may be given to you upon discharge.  Take your pain medication as prescribed, if needed.  If narcotic pain medicine is not needed, then you may take acetaminophen (Tylenol) or ibuprofen (Advil) as needed. 1,000mg  Tylenol & 200mg  Celebrex given at 0758am. No Tylenol or ibuprofen until 2:00pm if needed 2. Take your usually prescribed medications unless otherwise directed 3. If you need a refill on your pain medication, please contact your pharmacy.  They will contact our office to request authorization.  Prescriptions will not be filled after 5pm or on week-ends. 4. You should eat very light the first 24 hours after surgery, such as soup, crackers, pudding, etc.  Resume your normal diet the day after surgery. 5. Most patients will experience some swelling and bruising in the breast.  Ice packs and a good support bra will help.  Swelling and bruising can take several days to resolve.  6. It is common to experience some constipation if taking pain medication after surgery.  Increasing fluid intake and taking a stool softener will usually help or prevent this problem from occurring.  A mild laxative (Milk of Magnesia or Miralax) should be taken according to package directions if there are no bowel movements after 48 hours. 7. Unless discharge instructions indicate otherwise, you may remove your bandages 24-48 hours after surgery, and you may shower at that time.  You may have steri-strips (small skin tapes) in place directly over the incision.  These strips should be left on the skin for 7-10 days.  If your surgeon  used skin glue on the incision, you may shower in 24 hours.  The glue will flake off over the next 2-3 weeks.  Any sutures or staples will be removed at the office during your follow-up visit. 8. ACTIVITIES:  You may resume regular daily activities (gradually increasing) beginning the next day.  Wearing a good support bra or sports bra minimizes pain and swelling.  You may have sexual intercourse when it is comfortable. a. You may drive when you no longer are taking prescription pain medication, you can comfortably wear a seatbelt, and you can safely maneuver your car and apply brakes. b. RETURN TO WORK:  ______________________________________________________________________________________ 9. You should see your doctor in the office for a follow-up appointment approximately two weeks after your surgery.  Your doctors nurse will typically make your follow-up appointment when she calls you with your pathology report.  Expect your pathology report 2-3 business days after your surgery.  You may call to check if you do not hear from [place] after three days. 10. OTHER INSTRUCTIONS:OK TO SHOWER STARTING TOMORROW 11. ICE PACK, TYLENOL, AND IBUPROFEN ALSO FOR PAIN 12. NO VIGOROUS ACTIVITY FOR ONE WEEK _______________________________________________________________________________________________ _____________________________________________________________________________________________________________________________________ _____________________________________________________________________________________________________________________________________ _____________________________________________________________________________________________________________________________________  WHEN TO CALL YOUR DOCTOR: 1. Fever over 101.0 2. Nausea and/or vomiting. 3. Extreme swelling or bruising. 4. Continued bleeding from incision. 5. Increased pain, redness, or drainage from the incision.  The clinic staff is  available to answer your questions during regular business hours.  Please dont hesitate to call and ask to speak to one of the nurses for clinical concerns.  If  you have a medical emergency, go to the nearest emergency room or call 911.  A surgeon from [facility] is always on call at the hospital.  For further questions, please visit [email or web address]    Post Anesthesia Home Care Instructions  Activity: Get plenty of rest for the remainder of the day. A responsible individual must stay with you for 24 hours following the procedure.  For the next 24 hours, DO NOT: -Drive a car -Paediatric nurse -Drink alcoholic beverages -Take any medication unless instructed by your physician -Make any legal decisions or sign important papers.  Meals: Start with liquid foods such as gelatin or soup. Progress to regular foods as tolerated. Avoid greasy, spicy, heavy foods. If nausea and/or vomiting occur, drink only clear liquids until the nausea and/or vomiting subsides. Call your physician if vomiting continues.  Special Instructions/Symptoms: Your throat may feel dry or sore from the anesthesia or the breathing tube placed in your throat during surgery. If this causes discomfort, gargle with warm salt water. The discomfort should disappear within 24 hours.  If you had a scopolamine patch placed behind your ear for the management of post- operative nausea and/or vomiting:  1. The medication in the patch is effective for 72 hours, after which it should be removed.  Wrap patch in a tissue and discard in the trash. Wash hands thoroughly with soap and water. 2. You may remove the patch earlier than 72 hours if you experience unpleasant side effects which may include dry mouth, dizziness or visual disturbances. 3. Avoid touching the patch. Wash your hands with soap and water after contact with the patch.

## 2019-03-08 ENCOUNTER — Other Ambulatory Visit: Payer: Self-pay | Admitting: *Deleted

## 2019-03-08 ENCOUNTER — Encounter (HOSPITAL_BASED_OUTPATIENT_CLINIC_OR_DEPARTMENT_OTHER): Payer: Self-pay | Admitting: Surgery

## 2019-03-08 DIAGNOSIS — C9002 Multiple myeloma in relapse: Secondary | ICD-10-CM

## 2019-03-08 DIAGNOSIS — C9000 Multiple myeloma not having achieved remission: Secondary | ICD-10-CM

## 2019-03-11 ENCOUNTER — Encounter: Payer: Self-pay | Admitting: Adult Health

## 2019-03-11 DIAGNOSIS — C50411 Malignant neoplasm of upper-outer quadrant of right female breast: Secondary | ICD-10-CM | POA: Insufficient documentation

## 2019-03-11 DIAGNOSIS — Z170 Estrogen receptor positive status [ER+]: Secondary | ICD-10-CM

## 2019-03-12 ENCOUNTER — Other Ambulatory Visit: Payer: Self-pay

## 2019-03-12 ENCOUNTER — Inpatient Hospital Stay: Payer: BLUE CROSS/BLUE SHIELD | Attending: Oncology | Admitting: Oncology

## 2019-03-12 ENCOUNTER — Inpatient Hospital Stay: Payer: BLUE CROSS/BLUE SHIELD | Admitting: Oncology

## 2019-03-12 ENCOUNTER — Telehealth: Payer: Self-pay | Admitting: Oncology

## 2019-03-12 ENCOUNTER — Inpatient Hospital Stay: Payer: BLUE CROSS/BLUE SHIELD

## 2019-03-12 VITALS — BP 124/62 | HR 82 | Temp 98.4°F | Resp 18 | Ht 63.0 in | Wt 102.4 lb

## 2019-03-12 DIAGNOSIS — Z87891 Personal history of nicotine dependence: Secondary | ICD-10-CM

## 2019-03-12 DIAGNOSIS — C50411 Malignant neoplasm of upper-outer quadrant of right female breast: Secondary | ICD-10-CM | POA: Diagnosis present

## 2019-03-12 DIAGNOSIS — C50211 Malignant neoplasm of upper-inner quadrant of right female breast: Secondary | ICD-10-CM | POA: Diagnosis not present

## 2019-03-12 DIAGNOSIS — Z170 Estrogen receptor positive status [ER+]: Secondary | ICD-10-CM

## 2019-03-12 NOTE — Progress Notes (Signed)
[place]  [phone] [phone]    ID: KILANI JOFFE DOB: 09-25-1971  MR#: [id number]  NTI#:[id number]  Patient Care Team: Blair Heys, Hershal Coria as PCP - General (Physician Assistant) Arvella Nigh, MD as Consulting Physician (Obstetrics and Gynecology) Kashauna Celmer, Virgie Dad, MD as Consulting Physician (Oncology) Coralie Keens, MD as Consulting Physician (General Surgery) OTHER MD:   CHIEF COMPLAINT: Estrogen receptor positive breast cancer  CURRENT TREATMENT: Adjuvant radiation pending  HISTORY OF CURRENT ILLNESS: Ann Mckenzie has a history of breast calcifications.   Ann Mckenzie underwent routine mammography showing abnormal results. Then, on 09/15/2017 underwent bilateral mammography with tomography at the breast center showing breast density to be category BC.  There were 2 new groups of coarse heterogeneous calcifications in the upper outer and upper inner right breast, measuring 0.3 and 0.8 cm respectively.  Other groups of calcifications were stable.  Biopsy of these 2 groups on 09/22/2017 showed ([id number]) sclerosing adenosis, but no evidence of carcinoma.  More recently she again noted a change in the right breast. She underwent bilateral diagnostic mammography with tomography and right breast ultrasonography at [facility] on 02/20/2019 showing: Breast Density Category C. There is increased density in the region the patient's palpable lump best seen on the tangential view. No other suspicious findings are seen in either breast. On physical exam, there is a palpable lump in the right breast at 11 o'clock. Targeted ultrasound is performed, showing an ill-defined hypoechoic mass at 11 o'clock, 2 cm from the nipple in the region of the patient's palpable lump measuring 10 x 6 x 11 mm. No axillary adenopathy on the right.  Accordingly on 02/22/2019 she proceeded to biopsy of the right breast area in question. 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 Prognostic indicators significant for: estrogen receptor, 100% positive and progesterone receptor, 100% positive, both with strong staining intensity. Proliferation marker Ki67 at 2%. HER2 negative (1+).   Nelani then underwent a right breast lumpectomy on 03/06/2019. The pathology from this procedure showed ([id number]): 1. Breast, lumpectomy, right - Invasive ductal carcinoma, nottingham grade 1 of 3, 0.8 cm - Ductal carcinoma in situ, intermediate grade - Calcifications associated with carcinoma - Carcinoma extends to the anterior margin -Usual ductal hyperplasia, adenosis and fibrocystic changes - See oncology table below.  2. Lymph node, sentinel, biopsy, right axillary - No carcinoma identified in one lymph node (0/1) 3. Lymph node, sentinel, biopsy, right - No carcinoma identified in one lymph node (0/1) 4. Lymph node, sentinel, biopsy, right - No carcinoma identified in one lymph node (0/1) 5. Lymph node, sentinel, biopsy, right - No carcinoma identified in one lymph node (0/1) 6. Lymph node, sentinel, biopsy, right - No carcinoma identified in one lymph node (0/1) 7. Lymph node, sentinel, biopsy, right - No carcinoma identified in one lymph node (0/1) 8. Lymph node, sentinel, biopsy, right - No carcinoma identified in one lymph node (0/1) 9. Lymph node, sentinel, biopsy, right   - No carcinoma identified in one lymph node (0/1)  The patient's subsequent history is as detailed below.   INTERVAL HISTORY: Ann Mckenzie was evaluated in the breast cancer clinic on 03/12/2019.    REVIEW OF SYSTEMS: Rhys has been experiencing hot flashes at night, with diaphoresis since she discontinued her estrogen patches 2 weeks ago. Ann Mckenzie denies unusual headaches, visual changes, nausea, vomiting, stiff neck, dizziness, or gait imbalance. There has been no cough, phlegm production, or pleurisy, no chest pain or  pressure, and no change in  bowel or bladder habits. The patient denies fever, rash, bleeding, unexplained fatigue or unexplained weight loss. A detailed review of systems was otherwise entirely negative.   PAST MEDICAL HISTORY: Past Medical History:  Diagnosis Date   Eczema    Endometriosis    GERD (gastroesophageal reflux disease)    HSV-1 (herpes simplex virus 1) infection    h/o cold sores     PAST SURGICAL HISTORY: Past Surgical History:  Procedure Laterality Date   AUGMENTATION MAMMAPLASTY Bilateral 2010   BREAST BIOPSY     BREAST ENHANCEMENT SURGERY     dental implants     HERNIA REPAIR     PARTIAL MASTECTOMY WITH AXILLARY SENTINEL LYMPH NODE BIOPSY Right 03/06/2019   Procedure: RIGHT BREAST PARTIAL MASTECTOMY WITH SENTINEL LYMPH NODE BIOPSY;  Surgeon: Coralie Keens, MD;  Location: [place];  Service: General;  Laterality: Right;  Caesarian.  Blepharoplasty   FAMILY HISTORY: Family History  Problem Relation Age of Onset   Lung cancer Father    Ann Mckenzie's father died from lung cancer at age 26; he was a smoker. [name] is 61 as of 03/12/2019. The patient has no siblings. Patient denies anyone in her family having ovarian, prostate, or pancreatic cancer. Corita does have a paternal aunt who was diagnosed with breast cancer, but the patient does not know at what age.    GYNECOLOGIC HISTORY:  Patient's last menstrual period was 02/03/2011. 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 Maryuri's daughter, Ann Mckenzie, attends the [employer] and will graduate with a degree in Business with a minor in Marketing.  ADVANCED DIRECTIVES: Simra was given the appropriate forms on  03/12/19 to fill out and return at their own discretion.  Her ex-husband Tanja Port is currently her healthcare power of attorney.  He works as a Engineer, structural in [employer].   HEALTH MAINTENANCE: Social History   Tobacco Use   Smoking status: Former Smoker   Smokeless tobacco: Never Used  Substance Use Topics   Alcohol use: Yes    Comment: socially   Drug use: Never    Colonoscopy: no  PAP: up to date  Bone density: no Mammography: up to date  No Known Allergies  Current Outpatient Medications  Medication Sig Dispense Refill   estradiol (VIVELLE-DOT) 0.0375 MG/24HR Place 1 patch onto the skin 2 (two) times a week.     oxyCODONE (OXY IR/ROXICODONE) 5 MG immediate release tablet Take 1 tablet (5 mg total) by mouth every 6 (six) hours as needed for moderate pain or severe pain. (Patient not taking: Reported on 03/12/2019) 30 tablet 0   No current facility-administered medications for this visit.      OBJECTIVE: Middle-aged white woman who appears younger than stated age  91:   03/12/19 0901  BP: 124/62  Pulse: 82  Resp: 18  Temp: 98.4 F (36.9 C)  SpO2: 100%     Body mass index is 18.14 kg/m.   Wt Readings from Last 3 Encounters:  03/12/19 102 lb 6.4 oz (46.4 kg)  03/06/19 98 lb 8.7 oz (44.7 kg)      ECOG FS:0 - Asymptomatic  Ocular: Sclerae unicteric, pupils round and equal Ear-nose-throat: Oropharynx clear  and moist Lymphatic: No cervical or supraclavicular adenopathy Lungs no rales or rhonchi Heart regular rate and rhythm Abd soft, nontender, positive bowel sounds MSK no focal spinal tenderness, no joint edema Neuro: non-focal, well-oriented, appropriate affect Breasts: The right breast is status post recent lumpectomy.  The incision is healing nicely, with no erythema, swelling or dehiscence.  The cosmetic result is excellent.  There is slight swelling but no erythema associated with the axillary incision, suggestive of a small seroma.  The  left breast is unremarkable.  The left axilla is benign.   LAB RESULTS:  CMP  No results found for: NA, K, CL, CO2, GLUCOSE, BUN, CREATININE, CALCIUM, PROT, ALBUMIN, AST, ALT, ALKPHOS, BILITOT, GFRNONAA, GFRAA  No results found for: TOTALPROTELP, ALBUMINELP, A1GS, A2GS, BETS, BETA2SER, GAMS, MSPIKE, SPEI  No results found for: KPAFRELGTCHN, LAMBDASER, KAPLAMBRATIO  No results found for: WBC, NEUTROABS, HGB, HCT, MCV, PLT  _0 @  No results found for: LABCA2  No components found for: [id number]  No results for input(s): INR in the last 168 hours.  No results found for: LABCA2  No results found for: GHW299  No results found for: BZJ696  No results found for: VEL381  No results found for: CA2729  No components found for: HGQUANT  No results found for: CEA1 / No results found for: CEA1   No results found for: AFPTUMOR  No results found for: CHROMOGRNA  No results found for: PSA1  No visits with results within 3 Day(s) from this visit.  Latest known visit with results is:  No results found for any previous visit.    (this displays the last labs from the last 3 days)  No results found for: TOTALPROTELP, ALBUMINELP, A1GS, A2GS, BETS, BETA2SER, GAMS, MSPIKE, SPEI (this displays SPEP labs)  No results found for: KPAFRELGTCHN, LAMBDASER, KAPLAMBRATIO (kappa/lambda light chains)  No results found for: HGBA, HGBA2QUANT, HGBFQUANT, HGBSQUAN (Hemoglobinopathy evaluation)   No results found for: LDH  No results found for: IRON, TIBC, IRONPCTSAT (Iron and TIBC)  No results found for: FERRITIN  Urinalysis No results found for: COLORURINE, APPEARANCEUR, LABSPEC, PHURINE, GLUCOSEU, HGBUR, BILIRUBINUR, KETONESUR, PROTEINUR, UROBILINOGEN, NITRITE, LEUKOCYTESUR   STUDIES:  Nm Sentinel Node Inj-no Rpt (breast)  Result Date: 03/06/2019 Sulfur colloid was injected by the nuclear medicine technologist for melanoma sentinel node.   US Breast Ltd Uni Right Inc  Axilla  Result Date: 02/20/2019 CLINICAL DATA:  The patient presents with a palpable lump. EXAM: DIGITAL DIAGNOSTIC BILATERAL MAMMOGRAM WITH IMPLANTS, CAD AND TOMO ULTRASOUND RIGHT BREAST The patient has retropectoral implants. Standard and implant displaced views were performed. COMPARISON:  Previous exam(s). ACR Breast Density Category c: The breast tissue is heterogeneously dense, which may obscure small masses. FINDINGS: There is increased density in the region the patient's palpable lump best seen on the tangential view. No other suspicious findings are seen in either breast. On physical exam, there is a palpable lump in the right breast at 11 o'clock. Targeted ultrasound is performed, showing an ill-defined hypoechoic mass at 11 o'clock, 2 cm from the nipple in the region of the patient's palpable lump measuring 10 x 6 x 11 mm. No axillary adenopathy on the right. Mammographic images were processed with CAD. IMPRESSION: Suspicious mass in the right breast correlating with the patient's palpable lump. No other evidence of malignancy in either breast. RECOMMENDATION: Ultrasound-guided biopsy of the palpable right breast mass at 11 o'clock. I have discussed the findings and recommendations with the patient. Results were also provided in writing at  the conclusion of the visit. If applicable, a reminder letter will be sent to the patient regarding the next appointment. BI-RADS CATEGORY  4: Suspicious. Electronically Signed   By: Dorise Bullion III M.D   On: 02/20/2019 08:48   Mm Diag Breast W/implant Tomo Bilateral  Result Date: 02/20/2019 CLINICAL DATA:  The patient presents with a palpable lump. EXAM: DIGITAL DIAGNOSTIC BILATERAL MAMMOGRAM WITH IMPLANTS, CAD AND TOMO ULTRASOUND RIGHT BREAST The patient has retropectoral implants. Standard and implant displaced views were performed. COMPARISON:  Previous exam(s). ACR Breast Density Category c: The breast tissue is heterogeneously dense, which may obscure  small masses. FINDINGS: There is increased density in the region the patient's palpable lump best seen on the tangential view. No other suspicious findings are seen in either breast. On physical exam, there is a palpable lump in the right breast at 11 o'clock. Targeted ultrasound is performed, showing an ill-defined hypoechoic mass at 11 o'clock, 2 cm from the nipple in the region of the patient's palpable lump measuring 10 x 6 x 11 mm. No axillary adenopathy on the right. Mammographic images were processed with CAD. IMPRESSION: Suspicious mass in the right breast correlating with the patient's palpable lump. No other evidence of malignancy in either breast. RECOMMENDATION: Ultrasound-guided biopsy of the palpable right breast mass at 11 o'clock. I have discussed the findings and recommendations with the patient. Results were also provided in writing at the conclusion of the visit. If applicable, a reminder letter will be sent to the patient regarding the next appointment. BI-RADS CATEGORY  4: Suspicious. Electronically Signed   By: Dorise Bullion III M.D   On: 02/20/2019 08:48   Mm Clip Placement Right  Result Date: 02/22/2019 CLINICAL DATA:  Status post ultrasound-guided core biopsy of a right breast mass. EXAM: DIAGNOSTIC RIGHT MAMMOGRAM POST ULTRASOUND BIOPSY COMPARISON:  Previous exam(s). FINDINGS: Mammographic images were obtained following ultrasound guided biopsy of a mass in the 11 o'clock region of the right breast. Mammographic images show there is a ribbon shaped clip in the upper-outer quadrant of the right breast in appropriate position. IMPRESSION: Status post ultrasound-guided core biopsy of the right breast with pathology pending. Final Assessment: Post Procedure Mammograms for Marker Placement Electronically Signed   By: Lillia Mountain M.D.   On: 02/22/2019 08:27   [place] Rt Breast Bx W Loc Dev 1st Lesion Img Bx Spec US Guide  Addendum Date: 03/04/2019   ADDENDUM REPORT: 02/26/2019 07:04 ADDENDUM:  Pathology revealed GRADE I-II INVASIVE DUCTAL CARCINOMA of the Right breast, 11 o'clock. This was found to be concordant by Dr. Lillia Mountain. Pathology results were discussed with the patient by telephone. The patient reported doing well after the biopsy with tenderness at the site. Post biopsy instructions and care were reviewed and questions were answered. The patient was encouraged to call [facility] for any additional concerns. Surgical consultation has been arranged with Dr. Nedra Hai at [facility] on March 01, 2019. Pathology results reported by Terie Purser, RN on 02/26/2019. Electronically Signed   By: Lillia Mountain M.D.   On: 02/26/2019 07:04   Result Date: 03/04/2019 CLINICAL DATA:  Right breast mass. EXAM: ULTRASOUND GUIDED RIGHT BREAST CORE NEEDLE BIOPSY COMPARISON:  Previous exam(s). FINDINGS: I met with the patient and we discussed the procedure of ultrasound-guided biopsy, including benefits and alternatives. We discussed the high likelihood of a successful procedure. We discussed the risks of the procedure, including infection, bleeding, tissue injury, clip migration, and inadequate  sampling. Informed written consent was given. The usual time-out protocol was performed immediately prior to the procedure. Lesion quadrant: Upper-outer quadrant Using sterile technique and 1% lidocaine and 1% lidocaine with epinephrine as local anesthetic, under direct ultrasound visualization, a 14 gauge spring-loaded device was used to perform biopsy of a mass in the 11 o'clock region of the right breast using a medial to lateral approach. At the conclusion of the procedure a ribbon shaped tissue marker clip was deployed into the biopsy cavity. Follow up 2 view mammogram was performed and dictated separately. IMPRESSION: Ultrasound guided biopsy of the right breast. No apparent complications. Electronically Signed: By: Lillia Mountain M.D. On: 02/22/2019 08:11     ELIGIBLE  FOR AVAILABLE RESEARCH PROTOCOL: no   ASSESSMENT: 48 y.o. [place] woman status post right lumpectomy and sentinel lymph node sampling 03/06/2019 for a pT1b pN0, stage IA invasive ductal carcinoma, grade 1, estrogen and progesterone receptor strongly positive, HER-2 not amplified, with an MIB-1 of 2%.  (a) a total of 8 lymph nodes were removed  (b) the anterior margin was broadly positive--this requires no further surgery  (1) adjuvant radiation pending  (2) antiestrogens to follow, likely tamoxifen  (3) genetics testing to be scheduled   PLAN: I spent approximately 60 minutes face to face with Tamre with more than 50% of that time spent in counseling and coordination of care. Specifically we reviewed the biology of the patient's diagnosis and the specifics of her situation.  We first reviewed the fact that cancer is not one disease but more than 100 different diseases and that it is important to keep them separate-- otherwise when friends and relatives discuss their own cancer experiences with Ann Mckenzie confusion can result. Similarly we explained that if breast cancer spreads to the bone or liver, the patient would not have bone cancer or liver cancer, but breast cancer in the bone and breast cancer in the liver: one cancer in three places-- not 3 different cancers which otherwise would have to be treated in 3 different ways.  We discussed the difference between local and systemic therapy. In terms of loco-regional treatment, lumpectomy plus radiation is equivalent to mastectomy as far as survival is concerned. For this reason, and because the cosmetic results are generally superior, we recommended breast conserving surgery.   We then discussed the rationale for systemic therapy. There is some risk that this cancer may have already spread to other parts of her body. Patients frequently ask at this point about bone scans, CAT scans and PET scans to find out if they have occult breast cancer  somewhere else. The problem is that in early stage disease we are much more likely to find false positives then true cancers and this would expose the patient to unnecessary procedures as well as unnecessary radiation. Scans cannot answer the question the patient really would like to know, which is whether she has microscopic disease elsewhere in her body. For those reasons we do not recommend them.  Of course we would proceed to aggressive evaluation of any symptoms that might suggest metastatic disease, but that is not the case here.  Next we went over the options for systemic therapy which are anti-estrogens, anti-HER-2 immunotherapy, and chemotherapy. Roshawnda does not meet criteria for anti-HER-2 immunotherapy. She is a good candidate for anti-estrogens.  The question of chemotherapy is more complicated. Chemotherapy is most effective in rapidly growing, aggressive tumors. It is much less effective in low-grade, slow growing cancers, like Jamiah 's. For that reason  we are going to request an Oncotype from the definitive surgical sample, as suggested by NCCN guidelines.  However the patient understands I do not anticipate her needing chemotherapy to have an excellent long-term prognosis  Alisse also qualifies for genetics testing. In patients who carry a deleterious mutation [for example in a  BRCA gene], the risk of a new breast cancer developing in the future may be sufficiently great that the patient may choose bilateral mastectomies. However if she wishes to keep her breasts in that situation it is safe to do so. That would require intensified screening, which generally means not only yearly mammography but a yearly breast MRI as well.  Denice is comfortable with the idea of intensified screening if she ended up carrying a deleterious mutation so genetics is going to be done in May or June once the current viral issues (hopefully) have resolved  The overall plan then is to proceed to radiation, and then  return to see me to discuss antiestrogens.  Finally we discussed the recent concerns regarding ridged silicone implants.  She does have the card that she was given by her surgeon, Dr. Stephanie Coup, when she had the implants placed and she will bring it to the next visit for [place] to review.  [place] has a good understanding of the overall plan. She agrees with it. She knows the goal of treatment in her case is cure. She will call with any problems that may develop before her next visit here.    Cyntia Staley, Virgie Dad, MD  03/12/19 9:55 AM Medical Oncology and Hematology [facility] [address] [place],  [address] Tel. [phone]    Fax. [phone]   I, Jacqualyn Posey am acting as a Education administrator for Chauncey Cruel, MD.   I, Lurline Del MD, have reviewed the above documentation for accuracy and completeness, and I agree with the above.

## 2019-03-12 NOTE — Telephone Encounter (Signed)
Called regarding appointment on 6/3

## 2019-03-13 ENCOUNTER — Telehealth: Payer: Self-pay | Admitting: *Deleted

## 2019-03-13 NOTE — Telephone Encounter (Signed)
Ordered oncotype per Dr. Jana Hakim.  Faxed requisition to pathology and confirmed receipt.   Oncology Nurse Navigator Documentation  Navigator Location: [facility] (03/13/19 1400) Referral date to RadOnc/MedOnc: 03/11/19 (03/13/19 1400) )Navigator Encounter Type: Introductory phone call (03/13/19 1400)   Abnormal Finding Date: 02/20/19 (03/13/19 1400) Confirmed Diagnosis Date: 02/22/19 (03/13/19 1400) Surgery Date: 03/06/19 (03/13/19 1400) Genetic Counseling Date: 05/22/19 (03/13/19 1400) Genetic Counseling Type: Non-Urgent (03/13/19 1400)       Treatment Initiated Date: 03/06/19 (03/13/19 1400) Patient Visit Type: MedOnc;Initial (03/13/19 1400)                    Acuity: Level 2 (03/13/19 1400)   Acuity Level 2: Initial guidance, education and coordination as needed;Educational needs;Assistance expediting appointments;Ongoing guidance and education throughout treatment as needed (03/13/19 1400)

## 2019-03-19 NOTE — Progress Notes (Signed)
Location of Breast Cancer: RIGHT breast 11 o'clock, 2 cm from the nipple   Histology per Pathology Report: 03/06/19:  Diagnosis 1. Breast, lumpectomy, right - INVASIVE DUCTAL CARCINOMA, NOTTINGHAM GRADE 1 OF 3, 0.8 CM - DUCTAL CARCINOMA IN SITU, INTERMEDIATE GRADE - CALCIFICATIONS ASSOCIATED WITH CARCINOMA - CARCINOMA EXTENDS TO THE ANTERIOR MARGIN - USUAL DUCTAL HYPERPLASIA, ADENOSIS AND FIBROCYSTIC CHANGES - SEE ONCOLOGY TABLE BELOW 2. Lymph node, sentinel, biopsy, right axillary - NO CARCINOMA IDENTIFIED IN ONE LYMPH NODE (0/1) 3. Lymph node, sentinel, biopsy, right - NO CARCINOMA IDENTIFIED IN ONE LYMPH NODE (0/1) 4. Lymph node, sentinel, biopsy, right - NO CARCINOMA IDENTIFIED IN ONE LYMPH NODE (0/1) 5. Lymph node, sentinel, biopsy, right - NO CARCINOMA IDENTIFIED IN ONE LYMPH NODE (0/1) 6. Lymph node, sentinel, biopsy, right - NO CARCINOMA IDENTIFIED IN ONE LYMPH NODE (0/1) 7. Lymph node, sentinel, biopsy, right - NO CARCINOMA IDENTIFIED IN ONE LYMPH NODE (0/1) 8. Lymph node, sentinel, biopsy, right - NO CARCINOMA IDENTIFIED IN ONE LYMPH NODE (0/1) 9. Lymph node, sentinel, biopsy, right - NO CARCINOMA IDENTIFIED IN ONE LYMPH NODE (0/1)  Receptor Status: Estrogen Receptor: Positive (100%, strong) Progesterone Receptor: Positive (100%, strong) HER2: Negative (1+, IHC) ki-67: 2%  Did patient present with symptoms (if so, please note symptoms) or was this found on screening mammography?: More recently she again noted a change in the right breast. She underwent bilateral diagnostic mammography with tomography and right breast ultrasonography at [facility] on 02/20/2019 showing: Breast Density Category C. There is increased density in the region the patient's palpable lump best seen on the tangential view. No other suspicious findings are seen in either breast. On physical exam, there is a palpable lump in the right breast at 11 o'clock. Targeted ultrasound is performed, showing  an ill-defined hypoechoic mass at 11 o'clock, 2 cm from the nipple in the region of the patient's palpable lump measuring 10 x 6 x 11 mm. No axillary adenopathy on the right.  Accordingly on 02/22/2019 she proceeded to biopsy of the right breast area in question. The pathology from this procedure showed (02/22/2019): invasive ductal carcinoma, 11 o'clock. Prognostic indicators significant for: estrogen receptor, 100% positive and progesterone receptor, 100% positive, both with strong staining intensity. Proliferation marker Ki67 at 2%. HER2 negative (1+).   Tabetha then underwent a right breast lumpectomy on 03/06/2019  Past/Anticipated interventions by surgeon, if any: 03/06/19:  Procedure(s): RIGHT BREAST PARTIAL MASTECTOMY WITH DEEP RIGHT AXILLARY SENTINEL LYMPH NODE BIOPSY  Surgeon(s): Coralie Keens, MD  Past/Anticipated interventions by medical oncology, if any: Chemotherapy Per Dr. Jana Hakim 03/12/19:  ASSESSMENT: 48 y.o. [place] woman status post right lumpectomy and sentinel lymph node sampling 03/06/2019 for a pT1b pN0, stage IA invasive ductal carcinoma, grade 1, estrogen and progesterone receptor strongly positive, HER-2 not amplified, with an MIB-1 of 2%.             (a) a total of 8 lymph nodes were removed             (b) the anterior margin was broadly positive--this requires no further surgery  (1) adjuvant radiation pending  (2) antiestrogens to follow, likely tamoxifen  (3) genetics testing to be scheduled  Lymphedema issues, if any:  Not as this time.  Pain issues, if any: Pt c/o pain in axilla related to a recurring, large seroma. Pt rates pain 8/10.   SAFETY ISSUES:  Prior radiation? No  Pacemaker/ICD? No  Possible current pregnancy? No  Is the patient on methotrexate?  No  Current Complaints / other details:  Pt presents today for initial consult with Dr. Sondra Come for Radiation Oncology. Pt is unaccompanied.  BP 107/78 (BP Location: Left Arm, Patient  Position: Sitting)   Pulse 92   Temp 98.2 F (36.8 C) (Oral)   Resp 18   Ht 5' 3.5" (1.613 m)   Wt 100 lb (45.4 kg)   LMP 02/03/2011   SpO2 100%   BMI 17.44 kg/m   Wt Readings from Last 3 Encounters:  03/20/19 100 lb (45.4 kg)  03/12/19 102 lb 6.4 oz (46.4 kg)  03/06/19 98 lb 8.7 oz (44.7 kg)       Loma Sousa, RN 03/20/2019,2:41 PM

## 2019-03-20 ENCOUNTER — Encounter: Payer: Self-pay | Admitting: Radiation Oncology

## 2019-03-20 ENCOUNTER — Ambulatory Visit
Admission: RE | Admit: 2019-03-20 | Discharge: 2019-03-20 | Disposition: A | Discharge status: Home / Self Care | Payer: BLUE CROSS/BLUE SHIELD | Source: Ambulatory Visit | Attending: Radiation Oncology | Admitting: Radiation Oncology

## 2019-03-20 ENCOUNTER — Other Ambulatory Visit: Payer: Self-pay

## 2019-03-20 VITALS — BP 107/78 | HR 92 | Temp 98.2°F | Resp 18 | Ht 63.5 in | Wt 100.0 lb

## 2019-03-20 DIAGNOSIS — Z170 Estrogen receptor positive status [ER+]: Secondary | ICD-10-CM | POA: Insufficient documentation

## 2019-03-20 DIAGNOSIS — B009 Herpesviral infection, unspecified: Secondary | ICD-10-CM | POA: Insufficient documentation

## 2019-03-20 DIAGNOSIS — Z9011 Acquired absence of right breast and nipple: Secondary | ICD-10-CM | POA: Insufficient documentation

## 2019-03-20 DIAGNOSIS — C50411 Malignant neoplasm of upper-outer quadrant of right female breast: Secondary | ICD-10-CM

## 2019-03-20 DIAGNOSIS — C50911 Malignant neoplasm of unspecified site of right female breast: Secondary | ICD-10-CM | POA: Insufficient documentation

## 2019-03-20 DIAGNOSIS — K219 Gastro-esophageal reflux disease without esophagitis: Secondary | ICD-10-CM | POA: Diagnosis not present

## 2019-03-20 DIAGNOSIS — N809 Endometriosis, unspecified: Secondary | ICD-10-CM | POA: Diagnosis not present

## 2019-03-20 DIAGNOSIS — Z87891 Personal history of nicotine dependence: Secondary | ICD-10-CM | POA: Diagnosis not present

## 2019-03-20 DIAGNOSIS — Z801 Family history of malignant neoplasm of trachea, bronchus and lung: Secondary | ICD-10-CM | POA: Insufficient documentation

## 2019-03-20 DIAGNOSIS — L309 Dermatitis, unspecified: Secondary | ICD-10-CM | POA: Insufficient documentation

## 2019-03-20 DIAGNOSIS — Z510 Encounter for antineoplastic radiation therapy: Secondary | ICD-10-CM | POA: Insufficient documentation

## 2019-03-20 NOTE — Patient Instructions (Signed)
Coronavirus (COVID-19) Are you at risk?  Are you at risk for the Coronavirus (COVID-19)?  To be considered HIGH RISK for Coronavirus (COVID-19), you have to meet the following criteria:  . Traveled to [place], [place], [place], [place] or [place]; or in the [place] to [place], [place], [place], or [place]; and have fever, cough, and shortness of breath within the last 2 weeks of travel OR . Been in close contact with a person diagnosed with COVID-19 within the last 2 weeks and have fever, cough, and shortness of breath . IF YOU DO NOT MEET THESE CRITERIA, YOU ARE CONSIDERED LOW RISK FOR COVID-19.  What to do if you are HIGH RISK for COVID-19?  . If you are having a medical emergency, call 911. . Seek medical care right away. Before you go to a doctor's office, urgent care or emergency department, call ahead and tell them about your recent travel, contact with someone diagnosed with COVID-19, and your symptoms. You should receive instructions from your physician's office regarding next steps of care.  . When you arrive at healthcare provider, tell the healthcare staff immediately you have returned from visiting [place], [place], [place], [place] or [place]; or traveled in the [place] to [place], [place], [place], or [place]; in the last two weeks or you have been in close contact with a person diagnosed with COVID-19 in the last 2 weeks.   . Tell the health care staff about your symptoms: fever, cough and shortness of breath. . After you have been seen by a medical provider, you will be either: o Tested for (COVID-19) and discharged home on quarantine except to seek medical care if symptoms worsen, and asked to  - Stay home and avoid contact with others until you get your results (4-5 days)  - Avoid travel on public transportation if possible (such as bus, train, or airplane) or o Sent to the Emergency Department by EMS for evaluation, COVID-19 testing, and possible  admission depending on your condition and test results.  What to do if you are LOW RISK for COVID-19?  Reduce your risk of any infection by using the same precautions used for avoiding the common cold or flu:  . Wash your hands often with soap and warm water for at least 20 seconds.  If soap and water are not readily available, use an alcohol-based hand sanitizer with at least 60% alcohol.  . If coughing or sneezing, cover your mouth and nose by coughing or sneezing into the elbow areas of your shirt or coat, into a tissue or into your sleeve (not your hands). . Avoid shaking hands with others and consider head nods or verbal greetings only. . Avoid touching your eyes, nose, or mouth with unwashed hands.  . Avoid close contact with people who are sick. . Avoid places or events with large numbers of people in one location, like concerts or sporting events. . Carefully consider travel plans you have or are making. . If you are planning any travel outside or inside the [place], visit the CDC's Travelers' Health webpage for the latest health notices. . If you have some symptoms but not all symptoms, continue to monitor at home and seek medical attention if your symptoms worsen. . If you are having a medical emergency, call 911.   ADDITIONAL HEALTHCARE OPTIONS FOR PATIENTS  [facility] / e-Visit: [email or web address]         [facility]: [phone]  [facility]: [phone]                   [facility]: [phone]   

## 2019-03-20 NOTE — Progress Notes (Signed)
Radiation Oncology         [phone] ________________________________  Initial Outpatient Consultation  Name: Ann Mckenzie MRN: [id number]  Date: 03/20/2019  DOB: 27-Oct-1971  IZ:TIWP, Caryl Pina, PA-C  Magrinat, Virgie Dad, MD   REFERRING PHYSICIAN: Magrinat, Virgie Dad, MD  DIAGNOSIS: The encounter diagnosis was Malignant neoplasm of upper-outer quadrant of right breast in female, estrogen receptor positive ([place]).   Stage IA (pT1b, pN0) Right Breast UOQ Invasive Ductal Carcinoma, ER+ / PR+ / Her2-, Grade I    ICD-10-CM   1. Malignant neoplasm of upper-outer quadrant of right breast in female, estrogen receptor positive ([place]) C50.411    Z17.0     HISTORY OF PRESENT ILLNESS::Ann Mckenzie is a 48 y.o. female who is presenting to the office today for evaluation of her newly diagnosed breast cancer. She is doing well overall.   She presented to the [facility] after palpating a lump in her right breast. She underwent bilateral diagnostic mammography with tomography and right breast ultrasonography at [facility] on 02/20/19 showing: a suspicious mass in the right breast at 11 o'clock correlating with the patient's palpable lump. No other evidence of malignancy in either breast..  Biopsy on 02/22/19 showed: invasive ductal carcinoma, grade I-II in the right breast at 11 o'clock. Prognostic indicators significant for: estrogen receptor, 100% positive and progesterone receptor, 100% positive, both with strong staining intensity. Proliferation marker Ki67 at 2%. HER2 negative.  Right lumpectomy occurred on 03/06/19 and revealed invasive ductal carcinoma, grade 1, measuring 0.8 cm as well as DCIS intermediate grade and calcifications associated with carcinoma. The carcinoma extended to the anterior margin (skin). Usual ductal hyperplasia, adenosis and fibrocystic changes were noted. All lymph nodes were negative for malignancy. Surgery has been complicated by the development of a seroma in the  axillary scar region. The pt reports having this aspirated at least 4 separate times, and she is scheduled for additional aspiration later this week.   PREVIOUS RADIATION THERAPY: No  PAST MEDICAL HISTORY:  has a past medical history of Eczema, Endometriosis, GERD (gastroesophageal reflux disease), and HSV-1 (herpes simplex virus 1) infection.    PAST SURGICAL HISTORY: Past Surgical History:  Procedure Laterality Date   AUGMENTATION MAMMAPLASTY Bilateral 2010   BREAST BIOPSY     BREAST ENHANCEMENT SURGERY     dental implants     HERNIA REPAIR     PARTIAL MASTECTOMY WITH AXILLARY SENTINEL LYMPH NODE BIOPSY Right 03/06/2019   Procedure: RIGHT BREAST PARTIAL MASTECTOMY WITH SENTINEL LYMPH NODE BIOPSY;  Surgeon: Coralie Keens, MD;  Location: [facility];  Service: General;  Laterality: Right;    FAMILY HISTORY: family history includes Lung cancer in her father.  SOCIAL HISTORY:  reports that she has quit smoking. She has never used smokeless tobacco. She reports current alcohol use. She reports that she does not use drugs.  ALLERGIES: Patient has no known allergies.  MEDICATIONS:  Current Outpatient Medications  Medication Sig Dispense Refill   valACYclovir (VALTREX) 1000 MG tablet valacyclovir 1 gram tablet     estradiol (VIVELLE-DOT) 0.0375 MG/24HR Place 1 patch onto the skin 2 (two) times a week.     oxyCODONE (OXY IR/ROXICODONE) 5 MG immediate release tablet Take 1 tablet (5 mg total) by mouth every 6 (six) hours as needed for moderate pain or severe pain. (Patient not taking: Reported on 03/12/2019) 30 tablet 0   No current facility-administered medications for this encounter.     REVIEW OF SYSTEMS:  REVIEW OF  SYSTEMS: A 10+ POINT REVIEW OF SYSTEMS WAS OBTAINED including neurology, dermatology, psychiatry, cardiac, respiratory, lymph, extremities, GI, GU, musculoskeletal, constitutional, reproductive, HEENT. Patient denies any numbness, pain or swelling in  her right arm.    PHYSICAL EXAM:  height is 5' 3.5" (1.613 m) and weight is 100 lb (45.4 kg). Her oral temperature is 98.2 F (36.8 C). Her blood pressure is 107/78 and her pulse is 92. Her respiration is 18 and oxygen saturation is 100%.   General: Alert and oriented, in no acute distress HEENT: Head is normocephalic. Extraocular movements are intact. Oropharynx is clear. Neck: Neck is supple, no palpable cervical or supraclavicular lymphadenopathy. Heart: Regular in rate and rhythm with no murmurs, rubs, or gallops. Chest: Clear to auscultation bilaterally, with no rhonchi, wheezes, or rales. Abdomen: Soft, nontender, nondistended, with no rigidity or guarding. Extremities: No cyanosis or edema. Lymphatics: see Neck Exam Skin: No concerning lesions. Musculoskeletal: symmetric strength and muscle tone throughout. Neurologic: Cranial nerves II through XII are grossly intact. No obvious focalities. Speech is fluent. Coordination is intact. Psychiatric: Judgment and insight are intact. Affect is appropriate. Breast: Left breast with no palpable mass, nipple discharge, or bleeding. Cosmetic implant in place. Right breast with a well-healing periareolar scar in the upper outer quadrant. Patient also has a scar in the axillary region from her SLN, this area shows some swelling measuring approximately 3 x 3.5 cm consistent with seroma.    ECOG = 1  0 - Asymptomatic (Fully active, able to carry on all predisease activities without restriction)  1 - Symptomatic but completely ambulatory (Restricted in physically strenuous activity but ambulatory and able to carry out work of a light or sedentary nature. For example, light housework, office work)  2 - Symptomatic, <50% in bed during the day (Ambulatory and capable of all self care but unable to carry out any work activities. Up and about more than 50% of waking hours)  3 - Symptomatic, >50% in bed, but not bedbound (Capable of only limited  self-care, confined to bed or chair 50% or more of waking hours)  4 - Bedbound (Completely disabled. Cannot carry on any self-care. Totally confined to bed or chair)  5 - Death   Eustace Pen MM, Creech RH, Tormey DC, et al. [phone]). "Toxicity and response criteria of the [facility] Group". [place] Oncol. 5 (6): 649-55  LABORATORY DATA:  No results found for: WBC, HGB, HCT, MCV, PLT No results found for: NA, K, CL, CO2 No results found for: ALT, AST, GGT, ALKPHOS, BILITOT  PULMONARY FUNCTION TEST:   Recent Review Flowsheet Data    There is no flowsheet data to display.      RADIOGRAPHY: Nm Sentinel Node Inj-no Rpt (breast)  Result Date: 03/06/2019 Sulfur colloid was injected by the nuclear medicine technologist for melanoma sentinel node.   US Breast Ltd Uni Right Inc Axilla  Result Date: 02/20/2019 CLINICAL DATA:  The patient presents with a palpable lump. EXAM: DIGITAL DIAGNOSTIC BILATERAL MAMMOGRAM WITH IMPLANTS, CAD AND TOMO ULTRASOUND RIGHT BREAST The patient has retropectoral implants. Standard and implant displaced views were performed. COMPARISON:  Previous exam(s). ACR Breast Density Category c: The breast tissue is heterogeneously dense, which may obscure small masses. FINDINGS: There is increased density in the region the patient's palpable lump best seen on the tangential view. No other suspicious findings are seen in either breast. On physical exam, there is a palpable lump in the right breast at 11 o'clock. Targeted ultrasound is performed,  showing an ill-defined hypoechoic mass at 11 o'clock, 2 cm from the nipple in the region of the patient's palpable lump measuring 10 x 6 x 11 mm. No axillary adenopathy on the right. Mammographic images were processed with CAD. IMPRESSION: Suspicious mass in the right breast correlating with the patient's palpable lump. No other evidence of malignancy in either breast. RECOMMENDATION: Ultrasound-guided biopsy of the palpable  right breast mass at 11 o'clock. I have discussed the findings and recommendations with the patient. Results were also provided in writing at the conclusion of the visit. If applicable, a reminder letter will be sent to the patient regarding the next appointment. BI-RADS CATEGORY  4: Suspicious. Electronically Signed   By: Dorise Bullion III M.D   On: 02/20/2019 08:48   Mm Diag Breast W/implant Tomo Bilateral  Result Date: 02/20/2019 CLINICAL DATA:  The patient presents with a palpable lump. EXAM: DIGITAL DIAGNOSTIC BILATERAL MAMMOGRAM WITH IMPLANTS, CAD AND TOMO ULTRASOUND RIGHT BREAST The patient has retropectoral implants. Standard and implant displaced views were performed. COMPARISON:  Previous exam(s). ACR Breast Density Category c: The breast tissue is heterogeneously dense, which may obscure small masses. FINDINGS: There is increased density in the region the patient's palpable lump best seen on the tangential view. No other suspicious findings are seen in either breast. On physical exam, there is a palpable lump in the right breast at 11 o'clock. Targeted ultrasound is performed, showing an ill-defined hypoechoic mass at 11 o'clock, 2 cm from the nipple in the region of the patient's palpable lump measuring 10 x 6 x 11 mm. No axillary adenopathy on the right. Mammographic images were processed with CAD. IMPRESSION: Suspicious mass in the right breast correlating with the patient's palpable lump. No other evidence of malignancy in either breast. RECOMMENDATION: Ultrasound-guided biopsy of the palpable right breast mass at 11 o'clock. I have discussed the findings and recommendations with the patient. Results were also provided in writing at the conclusion of the visit. If applicable, a reminder letter will be sent to the patient regarding the next appointment. BI-RADS CATEGORY  4: Suspicious. Electronically Signed   By: Dorise Bullion III M.D   On: 02/20/2019 08:48   Mm Clip Placement Right  Result  Date: 02/22/2019 CLINICAL DATA:  Status post ultrasound-guided core biopsy of a right breast mass. EXAM: DIAGNOSTIC RIGHT MAMMOGRAM POST ULTRASOUND BIOPSY COMPARISON:  Previous exam(s). FINDINGS: Mammographic images were obtained following ultrasound guided biopsy of a mass in the 11 o'clock region of the right breast. Mammographic images show there is a ribbon shaped clip in the upper-outer quadrant of the right breast in appropriate position. IMPRESSION: Status post ultrasound-guided core biopsy of the right breast with pathology pending. Final Assessment: Post Procedure Mammograms for Marker Placement Electronically Signed   By: Lillia Mountain M.D.   On: 02/22/2019 08:27   [place] Rt Breast Bx W Loc Dev 1st Lesion Img Bx Spec US Guide  Addendum Date: 03/04/2019   ADDENDUM REPORT: 02/26/2019 07:04 ADDENDUM: Pathology revealed GRADE I-II INVASIVE DUCTAL CARCINOMA of the Right breast, 11 o'clock. This was found to be concordant by Dr. Lillia Mountain. Pathology results were discussed with the patient by telephone. The patient reported doing well after the biopsy with tenderness at the site. Post biopsy instructions and care were reviewed and questions were answered. The patient was encouraged to call [facility] for any additional concerns. Surgical consultation has been arranged with Dr. Nedra Hai at [facility] on March 01, 2019. Pathology results reported by Terie Purser, RN on 02/26/2019. Electronically Signed   By: Lillia Mountain M.D.   On: 02/26/2019 07:04   Result Date: 03/04/2019 CLINICAL DATA:  Right breast mass. EXAM: ULTRASOUND GUIDED RIGHT BREAST CORE NEEDLE BIOPSY COMPARISON:  Previous exam(s). FINDINGS: I met with the patient and we discussed the procedure of ultrasound-guided biopsy, including benefits and alternatives. We discussed the high likelihood of a successful procedure. We discussed the risks of the procedure, including infection, bleeding, tissue injury, clip  migration, and inadequate sampling. Informed written consent was given. The usual time-out protocol was performed immediately prior to the procedure. Lesion quadrant: Upper-outer quadrant Using sterile technique and 1% lidocaine and 1% lidocaine with epinephrine as local anesthetic, under direct ultrasound visualization, a 14 gauge spring-loaded device was used to perform biopsy of a mass in the 11 o'clock region of the right breast using a medial to lateral approach. At the conclusion of the procedure a ribbon shaped tissue marker clip was deployed into the biopsy cavity. Follow up 2 view mammogram was performed and dictated separately. IMPRESSION: Ultrasound guided biopsy of the right breast. No apparent complications. Electronically Signed: By: Lillia Mountain M.D. On: 02/22/2019 08:11      IMPRESSION: Stage IA (pT1b, pN0)   Patient will be a good candidate for breast conservation with radiotherapy to right breast. In light of the patient's cosmetic implants, she would not be a good candidate for hypofracitonated accelerated radiation therapy, would therefore recommend conventional radiation therapy over approximately 6.5 weeks. We discussed the general course of radiation, potential side effects, and toxicities with radiation and the patient is interested in this approach.  Patient understands the risk of radiation therapy as it relates to her implants.    PLAN:  Pt will be scheduled for CT simulation approximately 5 weeks out from her treatment with treatments to begin approximately 6 weeks post-op. We may have to consider delaying initiation of treatment if she continues having problems with her axillary seroma.    ------------------------------------------------  Blair Promise, PhD, MD This document serves as a record of services personally performed by Gery Pray, MD. It was created on his behalf by Mary-Margaret Loma Messing, a trained medical scribe. The creation of this record is based on the  scribe's personal observations and the provider's statements to them. This document has been checked and approved by the attending provider.

## 2019-03-27 ENCOUNTER — Telehealth: Payer: Self-pay | Admitting: *Deleted

## 2019-03-27 NOTE — Telephone Encounter (Signed)
Received oncotype results of 13/4%.  Left message for patient to return phone call to discuss results.

## 2019-04-03 ENCOUNTER — Encounter (HOSPITAL_COMMUNITY): Payer: Self-pay | Admitting: Oncology

## 2019-04-09 ENCOUNTER — Ambulatory Visit: Payer: BLUE CROSS/BLUE SHIELD | Admitting: Radiation Oncology

## 2019-04-16 ENCOUNTER — Ambulatory Visit: Payer: BLUE CROSS/BLUE SHIELD | Admitting: Radiation Oncology

## 2019-04-17 ENCOUNTER — Ambulatory Visit: Payer: BLUE CROSS/BLUE SHIELD

## 2019-04-18 ENCOUNTER — Ambulatory Visit: Payer: BLUE CROSS/BLUE SHIELD

## 2019-04-18 ENCOUNTER — Ambulatory Visit
Admission: RE | Admit: 2019-04-18 | Discharge: 2019-04-18 | Disposition: A | Discharge status: Home / Self Care | Payer: BLUE CROSS/BLUE SHIELD | Source: Ambulatory Visit | Attending: Radiation Oncology | Admitting: Radiation Oncology

## 2019-04-18 ENCOUNTER — Other Ambulatory Visit: Payer: Self-pay

## 2019-04-18 DIAGNOSIS — C50411 Malignant neoplasm of upper-outer quadrant of right female breast: Secondary | ICD-10-CM

## 2019-04-18 DIAGNOSIS — Z170 Estrogen receptor positive status [ER+]: Secondary | ICD-10-CM | POA: Diagnosis not present

## 2019-04-18 DIAGNOSIS — C50911 Malignant neoplasm of unspecified site of right female breast: Secondary | ICD-10-CM | POA: Diagnosis not present

## 2019-04-18 DIAGNOSIS — Z510 Encounter for antineoplastic radiation therapy: Secondary | ICD-10-CM | POA: Diagnosis not present

## 2019-04-18 NOTE — Progress Notes (Signed)
  Radiation Oncology         [phone] ________________________________  Name: Ann Mckenzie MRN: [id number]  Date: 04/18/2019  DOB: 01/28/1971  SIMULATION AND TREATMENT PLANNING NOTE    ICD-10-CM   1. Malignant neoplasm of upper-outer quadrant of right breast in female, estrogen receptor positive ([place]) C50.411    Z17.0     DIAGNOSIS:  48 y.o. 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  Identity was confirmed.  All relevant records and images related to the planned course of therapy were reviewed.  The patient freely provided informed written consent to proceed with treatment after reviewing the details related to the planned course of therapy. The consent form was witnessed and verified by the simulation staff.  Then, the patient was set-up in a stable reproducible  supine position for radiation therapy.  CT images were obtained.  Surface markings were placed.  The CT images were loaded into the planning software.  Then the target and avoidance structures were contoured.  Treatment planning then occurred.  The radiation prescription was entered and confirmed.  Then, I designed and supervised the construction of a total of 3 medically necessary complex treatment devices.  I have requested : 3D Simulation  I have requested a DVH of the following structures: Heart, lungs, lumpectomy cavity.  I have ordered:CBC  PLAN:  The patient will receive 50.4 Gy in 28 fractions followed by a boost to the lumpectomy cavity of 12 Gy in 6 fractions for a cumulative dose of 62.4 Gray.    Optical Surface Tracking Plan:  Since intensity modulated radiotherapy (IMRT) and 3D conformal radiation treatment methods are predicated on accurate and precise positioning for treatment, intrafraction motion monitoring is medically necessary to ensure accurate and safe treatment delivery.   The ability to quantify intrafraction motion without excessive ionizing radiation dose can only be performed with optical surface tracking. Accordingly, surface imaging offers the opportunity to obtain 3D measurements of patient position throughout IMRT and 3D treatments without excessive radiation exposure.  I am ordering optical surface tracking for this patient's upcoming course of radiotherapy. ________________________________    -----------------------------------  Blair Promise, PhD, MD  This document serves as a record of services personally performed by Gery Pray, MD. It was created on his behalf by Rae Lips, a trained medical scribe. The creation of this record is based on the scribe's personal observations and the provider's statements to them. This document has been checked and approved by the attending provider.

## 2019-04-19 ENCOUNTER — Ambulatory Visit: Payer: BC Managed Care – PPO

## 2019-04-22 ENCOUNTER — Ambulatory Visit: Payer: BC Managed Care – PPO

## 2019-04-23 ENCOUNTER — Ambulatory Visit: Payer: BC Managed Care – PPO

## 2019-04-23 DIAGNOSIS — Z510 Encounter for antineoplastic radiation therapy: Secondary | ICD-10-CM | POA: Diagnosis not present

## 2019-04-23 DIAGNOSIS — Z170 Estrogen receptor positive status [ER+]: Secondary | ICD-10-CM | POA: Diagnosis not present

## 2019-04-23 DIAGNOSIS — C50911 Malignant neoplasm of unspecified site of right female breast: Secondary | ICD-10-CM | POA: Insufficient documentation

## 2019-04-24 ENCOUNTER — Ambulatory Visit: Payer: BC Managed Care – PPO

## 2019-04-25 ENCOUNTER — Other Ambulatory Visit: Payer: Self-pay

## 2019-04-25 ENCOUNTER — Ambulatory Visit: Payer: BC Managed Care – PPO

## 2019-04-25 ENCOUNTER — Ambulatory Visit
Admission: RE | Admit: 2019-04-25 | Discharge: 2019-04-25 | Disposition: A | Discharge status: Home / Self Care | Payer: BC Managed Care – PPO | Source: Ambulatory Visit | Attending: Radiation Oncology | Admitting: Radiation Oncology

## 2019-04-25 DIAGNOSIS — Z510 Encounter for antineoplastic radiation therapy: Secondary | ICD-10-CM | POA: Diagnosis not present

## 2019-04-25 DIAGNOSIS — Z170 Estrogen receptor positive status [ER+]: Principal | ICD-10-CM

## 2019-04-25 DIAGNOSIS — C50411 Malignant neoplasm of upper-outer quadrant of right female breast: Secondary | ICD-10-CM

## 2019-04-25 NOTE — Progress Notes (Signed)
  Radiation Oncology         [phone] ________________________________  Name: KHADIJA THIER MRN: [id number]  Date: 04/25/2019  DOB: 1971/01/23  Simulation Verification Note    ICD-10-CM   1. Malignant neoplasm of upper-outer quadrant of right breast in female, estrogen receptor positive ([place]) C50.411    Z17.0     Status: outpatient  NARRATIVE: The patient was brought to the treatment unit and placed in the planned treatment position. The clinical setup was verified. Then port films were obtained and uploaded to the radiation oncology medical record software.  The treatment beams were carefully compared against the planned radiation fields. The position location and shape of the radiation fields was reviewed. They targeted volume of tissue appears to be appropriately covered by the radiation beams. Organs at risk appear to be excluded as planned.  Based on my personal review, I approved the simulation verification. The patient's treatment will proceed as planned.  -----------------------------------  Blair Promise, PhD, MD  This document serves as a record of services personally performed by Gery Pray, MD. It was created on his behalf by Rae Lips, a trained medical scribe. The creation of this record is based on the scribe's personal observations and the provider's statements to them. This document has been checked and approved by the attending provider.

## 2019-04-26 ENCOUNTER — Ambulatory Visit
Admission: RE | Admit: 2019-04-26 | Discharge: 2019-04-26 | Disposition: A | Discharge status: Home / Self Care | Payer: BC Managed Care – PPO | Source: Ambulatory Visit | Attending: Radiation Oncology | Admitting: Radiation Oncology

## 2019-04-26 ENCOUNTER — Ambulatory Visit: Payer: BC Managed Care – PPO

## 2019-04-26 ENCOUNTER — Other Ambulatory Visit: Payer: Self-pay

## 2019-04-26 DIAGNOSIS — Z510 Encounter for antineoplastic radiation therapy: Secondary | ICD-10-CM | POA: Diagnosis not present

## 2019-04-29 ENCOUNTER — Ambulatory Visit: Payer: BC Managed Care – PPO

## 2019-04-29 ENCOUNTER — Other Ambulatory Visit: Payer: Self-pay

## 2019-04-29 ENCOUNTER — Ambulatory Visit
Admission: RE | Admit: 2019-04-29 | Discharge: 2019-04-29 | Disposition: A | Discharge status: Home / Self Care | Payer: BC Managed Care – PPO | Source: Ambulatory Visit | Attending: Radiation Oncology | Admitting: Radiation Oncology

## 2019-04-29 DIAGNOSIS — Z510 Encounter for antineoplastic radiation therapy: Secondary | ICD-10-CM | POA: Diagnosis not present

## 2019-04-30 ENCOUNTER — Ambulatory Visit
Admission: RE | Admit: 2019-04-30 | Discharge: 2019-04-30 | Disposition: A | Discharge status: Home / Self Care | Payer: BC Managed Care – PPO | Source: Ambulatory Visit | Attending: Radiation Oncology | Admitting: Radiation Oncology

## 2019-04-30 ENCOUNTER — Ambulatory Visit: Payer: BC Managed Care – PPO

## 2019-04-30 ENCOUNTER — Other Ambulatory Visit: Payer: Self-pay

## 2019-04-30 DIAGNOSIS — Z510 Encounter for antineoplastic radiation therapy: Secondary | ICD-10-CM | POA: Diagnosis not present

## 2019-05-01 ENCOUNTER — Other Ambulatory Visit: Payer: Self-pay

## 2019-05-01 ENCOUNTER — Ambulatory Visit: Payer: BC Managed Care – PPO

## 2019-05-01 ENCOUNTER — Ambulatory Visit
Admission: RE | Admit: 2019-05-01 | Discharge: 2019-05-01 | Disposition: A | Discharge status: Home / Self Care | Payer: BC Managed Care – PPO | Source: Ambulatory Visit | Attending: Radiation Oncology | Admitting: Radiation Oncology

## 2019-05-01 DIAGNOSIS — Z510 Encounter for antineoplastic radiation therapy: Secondary | ICD-10-CM | POA: Diagnosis not present

## 2019-05-02 ENCOUNTER — Ambulatory Visit
Admission: RE | Admit: 2019-05-02 | Discharge: 2019-05-02 | Disposition: A | Discharge status: Home / Self Care | Payer: BC Managed Care – PPO | Source: Ambulatory Visit | Attending: Radiation Oncology | Admitting: Radiation Oncology

## 2019-05-02 ENCOUNTER — Ambulatory Visit: Payer: BC Managed Care – PPO

## 2019-05-02 ENCOUNTER — Other Ambulatory Visit: Payer: Self-pay

## 2019-05-02 DIAGNOSIS — Z510 Encounter for antineoplastic radiation therapy: Secondary | ICD-10-CM | POA: Diagnosis not present

## 2019-05-03 ENCOUNTER — Other Ambulatory Visit: Payer: Self-pay

## 2019-05-03 ENCOUNTER — Ambulatory Visit
Admission: RE | Admit: 2019-05-03 | Discharge: 2019-05-03 | Disposition: A | Discharge status: Home / Self Care | Payer: BC Managed Care – PPO | Source: Ambulatory Visit | Attending: Radiation Oncology | Admitting: Radiation Oncology

## 2019-05-03 ENCOUNTER — Ambulatory Visit: Payer: BC Managed Care – PPO

## 2019-05-03 DIAGNOSIS — Z510 Encounter for antineoplastic radiation therapy: Secondary | ICD-10-CM | POA: Diagnosis not present

## 2019-05-06 ENCOUNTER — Other Ambulatory Visit: Payer: Self-pay

## 2019-05-06 ENCOUNTER — Ambulatory Visit
Admission: RE | Admit: 2019-05-06 | Discharge: 2019-05-06 | Disposition: A | Discharge status: Home / Self Care | Payer: BC Managed Care – PPO | Source: Ambulatory Visit | Attending: Radiation Oncology | Admitting: Radiation Oncology

## 2019-05-06 ENCOUNTER — Ambulatory Visit: Payer: BC Managed Care – PPO

## 2019-05-06 DIAGNOSIS — Z510 Encounter for antineoplastic radiation therapy: Secondary | ICD-10-CM | POA: Diagnosis not present

## 2019-05-07 ENCOUNTER — Ambulatory Visit
Admission: RE | Admit: 2019-05-07 | Discharge: 2019-05-07 | Disposition: A | Discharge status: Home / Self Care | Payer: BC Managed Care – PPO | Source: Ambulatory Visit | Attending: Radiation Oncology | Admitting: Radiation Oncology

## 2019-05-07 ENCOUNTER — Other Ambulatory Visit: Payer: Self-pay

## 2019-05-07 ENCOUNTER — Ambulatory Visit: Payer: BC Managed Care – PPO

## 2019-05-07 DIAGNOSIS — Z510 Encounter for antineoplastic radiation therapy: Secondary | ICD-10-CM | POA: Diagnosis not present

## 2019-05-08 ENCOUNTER — Ambulatory Visit: Payer: BC Managed Care – PPO

## 2019-05-08 ENCOUNTER — Other Ambulatory Visit: Payer: Self-pay

## 2019-05-08 ENCOUNTER — Ambulatory Visit
Admission: RE | Admit: 2019-05-08 | Discharge: 2019-05-08 | Disposition: A | Discharge status: Home / Self Care | Payer: BC Managed Care – PPO | Source: Ambulatory Visit | Attending: Radiation Oncology | Admitting: Radiation Oncology

## 2019-05-08 DIAGNOSIS — Z510 Encounter for antineoplastic radiation therapy: Secondary | ICD-10-CM | POA: Diagnosis not present

## 2019-05-09 ENCOUNTER — Ambulatory Visit: Payer: BC Managed Care – PPO

## 2019-05-09 ENCOUNTER — Ambulatory Visit
Admission: RE | Admit: 2019-05-09 | Discharge: 2019-05-09 | Disposition: A | Discharge status: Home / Self Care | Payer: BC Managed Care – PPO | Source: Ambulatory Visit | Attending: Radiation Oncology | Admitting: Radiation Oncology

## 2019-05-09 ENCOUNTER — Other Ambulatory Visit: Payer: Self-pay

## 2019-05-09 DIAGNOSIS — Z510 Encounter for antineoplastic radiation therapy: Secondary | ICD-10-CM | POA: Diagnosis not present

## 2019-05-10 ENCOUNTER — Other Ambulatory Visit: Payer: Self-pay

## 2019-05-10 ENCOUNTER — Ambulatory Visit
Admission: RE | Admit: 2019-05-10 | Discharge: 2019-05-10 | Disposition: A | Discharge status: Home / Self Care | Payer: BC Managed Care – PPO | Source: Ambulatory Visit | Attending: Radiation Oncology | Admitting: Radiation Oncology

## 2019-05-10 ENCOUNTER — Ambulatory Visit: Payer: BC Managed Care – PPO

## 2019-05-10 DIAGNOSIS — Z510 Encounter for antineoplastic radiation therapy: Secondary | ICD-10-CM | POA: Diagnosis not present

## 2019-05-14 ENCOUNTER — Ambulatory Visit
Admission: RE | Admit: 2019-05-14 | Discharge: 2019-05-14 | Disposition: A | Discharge status: Home / Self Care | Payer: BC Managed Care – PPO | Source: Ambulatory Visit | Attending: Radiation Oncology | Admitting: Radiation Oncology

## 2019-05-14 ENCOUNTER — Ambulatory Visit: Payer: BC Managed Care – PPO

## 2019-05-14 ENCOUNTER — Other Ambulatory Visit: Payer: Self-pay

## 2019-05-14 DIAGNOSIS — Z510 Encounter for antineoplastic radiation therapy: Secondary | ICD-10-CM | POA: Diagnosis not present

## 2019-05-15 ENCOUNTER — Telehealth: Payer: Self-pay | Admitting: Oncology

## 2019-05-15 ENCOUNTER — Other Ambulatory Visit: Payer: Self-pay

## 2019-05-15 ENCOUNTER — Ambulatory Visit: Payer: BC Managed Care – PPO

## 2019-05-15 ENCOUNTER — Ambulatory Visit
Admission: RE | Admit: 2019-05-15 | Discharge: 2019-05-15 | Disposition: A | Discharge status: Home / Self Care | Payer: BC Managed Care – PPO | Source: Ambulatory Visit | Attending: Radiation Oncology | Admitting: Radiation Oncology

## 2019-05-15 DIAGNOSIS — Z510 Encounter for antineoplastic radiation therapy: Secondary | ICD-10-CM | POA: Diagnosis not present

## 2019-05-15 NOTE — Telephone Encounter (Signed)
Called patient regarding upcoming [facility] appointment, per patient's request this appointment will be cancelled. Patient will call back when ready to reschedule.  Message to [place].

## 2019-05-16 ENCOUNTER — Other Ambulatory Visit: Payer: Self-pay

## 2019-05-16 ENCOUNTER — Ambulatory Visit
Admission: RE | Admit: 2019-05-16 | Discharge: 2019-05-16 | Disposition: A | Discharge status: Home / Self Care | Payer: BC Managed Care – PPO | Source: Ambulatory Visit | Attending: Radiation Oncology | Admitting: Radiation Oncology

## 2019-05-16 ENCOUNTER — Ambulatory Visit: Payer: BC Managed Care – PPO

## 2019-05-16 DIAGNOSIS — Z510 Encounter for antineoplastic radiation therapy: Secondary | ICD-10-CM | POA: Diagnosis not present

## 2019-05-17 ENCOUNTER — Ambulatory Visit: Payer: BC Managed Care – PPO

## 2019-05-17 ENCOUNTER — Ambulatory Visit
Admission: RE | Admit: 2019-05-17 | Discharge: 2019-05-17 | Disposition: A | Discharge status: Home / Self Care | Payer: BC Managed Care – PPO | Source: Ambulatory Visit | Attending: Radiation Oncology | Admitting: Radiation Oncology

## 2019-05-17 ENCOUNTER — Other Ambulatory Visit: Payer: Self-pay

## 2019-05-17 DIAGNOSIS — Z510 Encounter for antineoplastic radiation therapy: Secondary | ICD-10-CM | POA: Diagnosis not present

## 2019-05-20 ENCOUNTER — Ambulatory Visit: Payer: BC Managed Care – PPO

## 2019-05-20 ENCOUNTER — Ambulatory Visit
Admission: RE | Admit: 2019-05-20 | Discharge: 2019-05-20 | Disposition: A | Discharge status: Home / Self Care | Payer: BC Managed Care – PPO | Source: Ambulatory Visit | Attending: Radiation Oncology | Admitting: Radiation Oncology

## 2019-05-20 ENCOUNTER — Other Ambulatory Visit: Payer: Self-pay

## 2019-05-20 DIAGNOSIS — Z170 Estrogen receptor positive status [ER+]: Secondary | ICD-10-CM | POA: Insufficient documentation

## 2019-05-20 DIAGNOSIS — C50911 Malignant neoplasm of unspecified site of right female breast: Secondary | ICD-10-CM | POA: Diagnosis not present

## 2019-05-20 DIAGNOSIS — Z510 Encounter for antineoplastic radiation therapy: Secondary | ICD-10-CM | POA: Diagnosis not present

## 2019-05-21 ENCOUNTER — Ambulatory Visit: Payer: BC Managed Care – PPO

## 2019-05-21 ENCOUNTER — Other Ambulatory Visit: Payer: Self-pay

## 2019-05-21 ENCOUNTER — Ambulatory Visit
Admission: RE | Admit: 2019-05-21 | Discharge: 2019-05-21 | Disposition: A | Discharge status: Home / Self Care | Payer: BC Managed Care – PPO | Source: Ambulatory Visit | Attending: Radiation Oncology | Admitting: Radiation Oncology

## 2019-05-21 DIAGNOSIS — Z510 Encounter for antineoplastic radiation therapy: Secondary | ICD-10-CM | POA: Diagnosis not present

## 2019-05-22 ENCOUNTER — Ambulatory Visit
Admission: RE | Admit: 2019-05-22 | Discharge: 2019-05-22 | Disposition: A | Discharge status: Home / Self Care | Payer: BC Managed Care – PPO | Source: Ambulatory Visit | Attending: Radiation Oncology | Admitting: Radiation Oncology

## 2019-05-22 ENCOUNTER — Ambulatory Visit: Payer: BC Managed Care – PPO

## 2019-05-22 ENCOUNTER — Encounter: Payer: BLUE CROSS/BLUE SHIELD | Admitting: Genetic Counselor

## 2019-05-22 ENCOUNTER — Other Ambulatory Visit: Payer: BLUE CROSS/BLUE SHIELD

## 2019-05-22 ENCOUNTER — Ambulatory Visit: Payer: BLUE CROSS/BLUE SHIELD | Admitting: Oncology

## 2019-05-22 ENCOUNTER — Other Ambulatory Visit: Payer: Self-pay

## 2019-05-22 DIAGNOSIS — Z510 Encounter for antineoplastic radiation therapy: Secondary | ICD-10-CM | POA: Diagnosis not present

## 2019-05-23 ENCOUNTER — Other Ambulatory Visit: Payer: Self-pay

## 2019-05-23 ENCOUNTER — Ambulatory Visit
Admission: RE | Admit: 2019-05-23 | Discharge: 2019-05-23 | Disposition: A | Discharge status: Home / Self Care | Payer: BC Managed Care – PPO | Source: Ambulatory Visit | Attending: Radiation Oncology | Admitting: Radiation Oncology

## 2019-05-23 ENCOUNTER — Ambulatory Visit: Payer: BC Managed Care – PPO

## 2019-05-23 DIAGNOSIS — Z510 Encounter for antineoplastic radiation therapy: Secondary | ICD-10-CM | POA: Diagnosis not present

## 2019-05-24 ENCOUNTER — Other Ambulatory Visit: Payer: Self-pay

## 2019-05-24 ENCOUNTER — Ambulatory Visit
Admission: RE | Admit: 2019-05-24 | Discharge: 2019-05-24 | Disposition: A | Discharge status: Home / Self Care | Payer: BC Managed Care – PPO | Source: Ambulatory Visit | Attending: Radiation Oncology | Admitting: Radiation Oncology

## 2019-05-24 ENCOUNTER — Ambulatory Visit: Payer: BC Managed Care – PPO

## 2019-05-24 DIAGNOSIS — Z510 Encounter for antineoplastic radiation therapy: Secondary | ICD-10-CM | POA: Diagnosis not present

## 2019-05-27 ENCOUNTER — Other Ambulatory Visit: Payer: Self-pay

## 2019-05-27 ENCOUNTER — Ambulatory Visit
Admission: RE | Admit: 2019-05-27 | Discharge: 2019-05-27 | Disposition: A | Discharge status: Home / Self Care | Payer: BC Managed Care – PPO | Source: Ambulatory Visit | Attending: Radiation Oncology | Admitting: Radiation Oncology

## 2019-05-27 ENCOUNTER — Ambulatory Visit: Payer: BC Managed Care – PPO

## 2019-05-27 DIAGNOSIS — Z510 Encounter for antineoplastic radiation therapy: Secondary | ICD-10-CM | POA: Diagnosis not present

## 2019-05-28 ENCOUNTER — Other Ambulatory Visit: Payer: Self-pay

## 2019-05-28 ENCOUNTER — Ambulatory Visit: Payer: BC Managed Care – PPO

## 2019-05-28 ENCOUNTER — Ambulatory Visit
Admission: RE | Admit: 2019-05-28 | Discharge: 2019-05-28 | Disposition: A | Discharge status: Home / Self Care | Payer: BC Managed Care – PPO | Source: Ambulatory Visit | Attending: Radiation Oncology | Admitting: Radiation Oncology

## 2019-05-28 DIAGNOSIS — Z510 Encounter for antineoplastic radiation therapy: Secondary | ICD-10-CM | POA: Diagnosis not present

## 2019-05-29 ENCOUNTER — Ambulatory Visit: Payer: BC Managed Care – PPO

## 2019-05-29 ENCOUNTER — Other Ambulatory Visit: Payer: Self-pay

## 2019-05-29 ENCOUNTER — Ambulatory Visit
Admission: RE | Admit: 2019-05-29 | Discharge: 2019-05-29 | Disposition: A | Discharge status: Home / Self Care | Payer: BC Managed Care – PPO | Source: Ambulatory Visit | Attending: Radiation Oncology | Admitting: Radiation Oncology

## 2019-05-29 DIAGNOSIS — Z510 Encounter for antineoplastic radiation therapy: Secondary | ICD-10-CM | POA: Diagnosis not present

## 2019-05-30 ENCOUNTER — Ambulatory Visit: Payer: BC Managed Care – PPO

## 2019-05-30 ENCOUNTER — Other Ambulatory Visit: Payer: Self-pay

## 2019-05-30 ENCOUNTER — Ambulatory Visit
Admission: RE | Admit: 2019-05-30 | Discharge: 2019-05-30 | Disposition: A | Discharge status: Home / Self Care | Payer: BC Managed Care – PPO | Source: Ambulatory Visit | Attending: Radiation Oncology | Admitting: Radiation Oncology

## 2019-05-30 DIAGNOSIS — Z510 Encounter for antineoplastic radiation therapy: Secondary | ICD-10-CM | POA: Diagnosis not present

## 2019-05-31 ENCOUNTER — Ambulatory Visit: Payer: BC Managed Care – PPO

## 2019-05-31 ENCOUNTER — Other Ambulatory Visit: Payer: Self-pay

## 2019-05-31 ENCOUNTER — Ambulatory Visit
Admission: RE | Admit: 2019-05-31 | Discharge: 2019-05-31 | Disposition: A | Discharge status: Home / Self Care | Payer: BC Managed Care – PPO | Source: Ambulatory Visit | Attending: Radiation Oncology | Admitting: Radiation Oncology

## 2019-05-31 DIAGNOSIS — Z510 Encounter for antineoplastic radiation therapy: Secondary | ICD-10-CM | POA: Diagnosis not present

## 2019-06-03 ENCOUNTER — Ambulatory Visit: Payer: BC Managed Care – PPO

## 2019-06-03 ENCOUNTER — Other Ambulatory Visit: Payer: Self-pay

## 2019-06-03 ENCOUNTER — Ambulatory Visit
Admission: RE | Admit: 2019-06-03 | Discharge: 2019-06-03 | Disposition: A | Discharge status: Home / Self Care | Payer: BC Managed Care – PPO | Source: Ambulatory Visit | Attending: Radiation Oncology | Admitting: Radiation Oncology

## 2019-06-03 DIAGNOSIS — Z510 Encounter for antineoplastic radiation therapy: Secondary | ICD-10-CM | POA: Diagnosis not present

## 2019-06-04 ENCOUNTER — Ambulatory Visit
Admission: RE | Admit: 2019-06-04 | Discharge: 2019-06-04 | Disposition: A | Discharge status: Home / Self Care | Payer: BC Managed Care – PPO | Source: Ambulatory Visit | Attending: Radiation Oncology | Admitting: Radiation Oncology

## 2019-06-04 ENCOUNTER — Other Ambulatory Visit: Payer: Self-pay

## 2019-06-04 DIAGNOSIS — C50411 Malignant neoplasm of upper-outer quadrant of right female breast: Secondary | ICD-10-CM

## 2019-06-04 DIAGNOSIS — Z510 Encounter for antineoplastic radiation therapy: Secondary | ICD-10-CM | POA: Diagnosis not present

## 2019-06-04 DIAGNOSIS — Z170 Estrogen receptor positive status [ER+]: Secondary | ICD-10-CM

## 2019-06-04 NOTE — Progress Notes (Signed)
.  Simulation verification  The patient was brought to the treatment machine and placed in the plan treatment position.  Clinical set up was verified to ensure that the target region is appropriately covered for the patient's upcoming electron boost treatment.  The targeted volume of tissue is appropriately covered by the radiation field.  Based on my personal review, I approve the simulation verification.  The patient's treatment will proceed as planned.  ------------------------------------------------  -----------------------------------  Alfie Alderfer D. Milianna Ericsson, PhD, MD  

## 2019-06-05 ENCOUNTER — Other Ambulatory Visit: Payer: Self-pay

## 2019-06-05 ENCOUNTER — Ambulatory Visit
Admission: RE | Admit: 2019-06-05 | Discharge: 2019-06-05 | Disposition: A | Discharge status: Home / Self Care | Payer: BC Managed Care – PPO | Source: Ambulatory Visit | Attending: Radiation Oncology | Admitting: Radiation Oncology

## 2019-06-05 DIAGNOSIS — Z510 Encounter for antineoplastic radiation therapy: Secondary | ICD-10-CM | POA: Diagnosis not present

## 2019-06-06 ENCOUNTER — Encounter: Payer: Self-pay | Admitting: Oncology

## 2019-06-06 ENCOUNTER — Ambulatory Visit
Admission: RE | Admit: 2019-06-06 | Discharge: 2019-06-06 | Disposition: A | Discharge status: Home / Self Care | Payer: BC Managed Care – PPO | Source: Ambulatory Visit | Attending: Radiation Oncology | Admitting: Radiation Oncology

## 2019-06-06 ENCOUNTER — Inpatient Hospital Stay: Payer: BC Managed Care – PPO | Attending: Oncology | Admitting: Oncology

## 2019-06-06 ENCOUNTER — Other Ambulatory Visit: Payer: Self-pay

## 2019-06-06 DIAGNOSIS — M2550 Pain in unspecified joint: Secondary | ICD-10-CM | POA: Insufficient documentation

## 2019-06-06 DIAGNOSIS — R232 Flushing: Secondary | ICD-10-CM | POA: Insufficient documentation

## 2019-06-06 DIAGNOSIS — Z510 Encounter for antineoplastic radiation therapy: Secondary | ICD-10-CM | POA: Diagnosis not present

## 2019-06-06 DIAGNOSIS — Z87891 Personal history of nicotine dependence: Secondary | ICD-10-CM | POA: Insufficient documentation

## 2019-06-06 DIAGNOSIS — Z803 Family history of malignant neoplasm of breast: Secondary | ICD-10-CM | POA: Insufficient documentation

## 2019-06-06 DIAGNOSIS — M797 Fibromyalgia: Secondary | ICD-10-CM | POA: Insufficient documentation

## 2019-06-06 DIAGNOSIS — Z170 Estrogen receptor positive status [ER+]: Secondary | ICD-10-CM

## 2019-06-06 DIAGNOSIS — Z923 Personal history of irradiation: Secondary | ICD-10-CM | POA: Insufficient documentation

## 2019-06-06 DIAGNOSIS — R5383 Other fatigue: Secondary | ICD-10-CM | POA: Insufficient documentation

## 2019-06-06 DIAGNOSIS — Z79811 Long term (current) use of aromatase inhibitors: Secondary | ICD-10-CM | POA: Insufficient documentation

## 2019-06-06 DIAGNOSIS — Z801 Family history of malignant neoplasm of trachea, bronchus and lung: Secondary | ICD-10-CM | POA: Insufficient documentation

## 2019-06-06 DIAGNOSIS — C50411 Malignant neoplasm of upper-outer quadrant of right female breast: Secondary | ICD-10-CM | POA: Insufficient documentation

## 2019-06-06 NOTE — Progress Notes (Signed)
No show

## 2019-06-07 ENCOUNTER — Ambulatory Visit
Admission: RE | Admit: 2019-06-07 | Discharge: 2019-06-07 | Disposition: A | Discharge status: Home / Self Care | Payer: BC Managed Care – PPO | Source: Ambulatory Visit | Attending: Radiation Oncology | Admitting: Radiation Oncology

## 2019-06-07 ENCOUNTER — Other Ambulatory Visit: Payer: Self-pay

## 2019-06-07 DIAGNOSIS — Z510 Encounter for antineoplastic radiation therapy: Secondary | ICD-10-CM | POA: Diagnosis not present

## 2019-06-10 ENCOUNTER — Ambulatory Visit
Admission: RE | Admit: 2019-06-10 | Discharge: 2019-06-10 | Disposition: A | Discharge status: Home / Self Care | Payer: BC Managed Care – PPO | Source: Ambulatory Visit | Attending: Radiation Oncology | Admitting: Radiation Oncology

## 2019-06-10 ENCOUNTER — Other Ambulatory Visit: Payer: Self-pay

## 2019-06-10 DIAGNOSIS — Z510 Encounter for antineoplastic radiation therapy: Secondary | ICD-10-CM | POA: Diagnosis not present

## 2019-06-11 ENCOUNTER — Ambulatory Visit
Admission: RE | Admit: 2019-06-11 | Discharge: 2019-06-11 | Disposition: A | Discharge status: Home / Self Care | Payer: BC Managed Care – PPO | Source: Ambulatory Visit | Attending: Radiation Oncology | Admitting: Radiation Oncology

## 2019-06-11 ENCOUNTER — Other Ambulatory Visit: Payer: Self-pay

## 2019-06-11 DIAGNOSIS — Z510 Encounter for antineoplastic radiation therapy: Secondary | ICD-10-CM | POA: Diagnosis not present

## 2019-06-12 ENCOUNTER — Other Ambulatory Visit: Payer: Self-pay

## 2019-06-12 ENCOUNTER — Encounter: Payer: Self-pay | Admitting: *Deleted

## 2019-06-12 ENCOUNTER — Ambulatory Visit: Payer: BC Managed Care – PPO

## 2019-06-12 ENCOUNTER — Encounter: Payer: Self-pay | Admitting: Radiation Oncology

## 2019-06-12 ENCOUNTER — Ambulatory Visit
Admission: RE | Admit: 2019-06-12 | Discharge: 2019-06-12 | Disposition: A | Discharge status: Home / Self Care | Payer: BC Managed Care – PPO | Source: Ambulatory Visit | Attending: Radiation Oncology | Admitting: Radiation Oncology

## 2019-06-12 DIAGNOSIS — Z510 Encounter for antineoplastic radiation therapy: Secondary | ICD-10-CM | POA: Diagnosis not present

## 2019-06-12 NOTE — Progress Notes (Signed)
  Radiation Oncology         [phone] ________________________________  Name: Ann Mckenzie MRN: [id number]  Date: 06/12/2019  DOB: 26-Nov-1971  End of Treatment Note  Diagnosis:   StageIA (pT1b, pN0) RightBreast UOQ Invasive Ductal Carcinoma, ER+/ PR+/ Her2-, GradeI     Indication for treatment:  curative       Radiation treatment dates:   04/25/19-06/12/19  Site/dose:   1. Right Breast; 50.4 Gy in 28 fractions of 1.8 Gy           2. Boost; 12 Gy in 6 fractions of 2 Gy  Beams/energy: 1.  3D Photon; 6X      2. 3D Photon; 6E, 9E  Narrative: The patient tolerated radiation treatment relatively well.   By the end of treatment, she had some mild fatigue and diffuse erythema without skin breakdown as well as hyperpigmentation in the right breast. Overall the pt was without complaints.   Plan: The patient has completed radiation treatment. The patient will return to radiation oncology clinic for routine followup in one month. I advised them to call or return sooner if they have any questions or concerns related to their recovery or treatment.  -----------------------------------  Blair Promise, PhD, MD  This document serves as a record of services personally performed by Gery Pray, MD. It was created on his behalf by Mary-Margaret Loma Messing, a trained medical scribe. The creation of this record is based on the scribe's personal observations and the provider's statements to them. This document has been checked and approved by the attending provider.

## 2019-06-13 ENCOUNTER — Ambulatory Visit: Payer: BC Managed Care – PPO

## 2019-06-18 ENCOUNTER — Inpatient Hospital Stay (HOSPITAL_BASED_OUTPATIENT_CLINIC_OR_DEPARTMENT_OTHER): Payer: BC Managed Care – PPO | Admitting: Oncology

## 2019-06-18 ENCOUNTER — Other Ambulatory Visit: Payer: Self-pay

## 2019-06-18 VITALS — BP 99/57 | HR 85 | Temp 99.1°F | Resp 18 | Ht 63.5 in | Wt 96.6 lb

## 2019-06-18 DIAGNOSIS — Z803 Family history of malignant neoplasm of breast: Secondary | ICD-10-CM | POA: Diagnosis not present

## 2019-06-18 DIAGNOSIS — Z87891 Personal history of nicotine dependence: Secondary | ICD-10-CM | POA: Diagnosis not present

## 2019-06-18 DIAGNOSIS — M2550 Pain in unspecified joint: Secondary | ICD-10-CM | POA: Diagnosis not present

## 2019-06-18 DIAGNOSIS — C50411 Malignant neoplasm of upper-outer quadrant of right female breast: Secondary | ICD-10-CM

## 2019-06-18 DIAGNOSIS — Z170 Estrogen receptor positive status [ER+]: Secondary | ICD-10-CM

## 2019-06-18 DIAGNOSIS — Z79811 Long term (current) use of aromatase inhibitors: Secondary | ICD-10-CM

## 2019-06-18 DIAGNOSIS — Z801 Family history of malignant neoplasm of trachea, bronchus and lung: Secondary | ICD-10-CM

## 2019-06-18 DIAGNOSIS — R232 Flushing: Secondary | ICD-10-CM | POA: Diagnosis not present

## 2019-06-18 DIAGNOSIS — Z923 Personal history of irradiation: Secondary | ICD-10-CM

## 2019-06-18 DIAGNOSIS — R5383 Other fatigue: Secondary | ICD-10-CM | POA: Diagnosis not present

## 2019-06-18 DIAGNOSIS — M797 Fibromyalgia: Secondary | ICD-10-CM | POA: Diagnosis not present

## 2019-06-18 NOTE — Progress Notes (Signed)
[facility]  [phone]) [phone] Fax:([phone]    ID: Ann Mckenzie DOB: 1971-04-07  MR#: [id number]  HKV#:[id number]  Patient Care Team: Blair Heys, Hershal Coria as PCP - General (Physician Assistant) Arvella Nigh, MD as Consulting Physician (Obstetrics and Gynecology) Eyad Rochford, Virgie Dad, MD as Consulting Physician (Oncology) Coralie Keens, MD as Consulting Physician (General Surgery) Gery Pray, MD as Consulting Physician (Radiation Oncology) OTHER MD:   CHIEF COMPLAINT: Estrogen receptor positive breast cancer  CURRENT TREATMENT: To start tamoxifen   INTERVAL HISTORY: [name] returns today for follow up and treatment of her estrogen receptor positive breast cancer.  Oncotype DX was obtained on the final surgical sample and the recurrence score of 13 predicts a risk of recurrence outside the breast over the next 9 years of 4%, if the patient's only systemic therapy is an antiestrogen for 5 years.  It also predicts no benefit from chemotherapy.  Accordingly she proceeded directly to radiation.  She met with Dr. Sondra Come on 03/20/2019 and underwent radiation therapy from 04/25/2019 through 06/12/2019. She reports it went very well, and she enjoyed the nurses. She does note some fatigue.  She will be following up with Dr. Arlyn Dunning late July.  She is here today to review options for antiestrogen therapy   REVIEW OF SYSTEMS: Dillan reports walking 2 miles five days a week. Her daughter, age 13, is working part time at a Pharmacist, community. She just graduated from the [employer] with a major in business. She wants to get into medical sales. She states she is very pleased with her cancer treatment progress. A detailed review of systems was otherwise entirely negative.   HISTORY OF CURRENT ILLNESS: Ann Mckenzie has a history of breast calcifications.   Ann Mckenzie underwent routine mammography showing abnormal results. Then, on 09/15/2017 underwent bilateral mammography with  tomography at the breast center showing breast density to be category BC.  There were 2 new groups of coarse heterogeneous calcifications in the upper outer and upper inner right breast, measuring 0.3 and 0.8 cm respectively.  Other groups of calcifications were stable.  Biopsy of these 2 groups on 09/22/2017 showed ([id number]) sclerosing adenosis, but no evidence of carcinoma.  More recently she again noted a change in the right breast. She underwent bilateral diagnostic mammography with tomography and right breast ultrasonography at [facility] on 02/20/2019 showing: Breast Density Category C. There is increased density in the region the patient's palpable lump best seen on the tangential view. No other suspicious findings are seen in either breast. On physical exam, there is a palpable lump in the right breast at 11 o'clock. Targeted ultrasound is performed, showing an ill-defined hypoechoic mass at 11 o'clock, 2 cm from the nipple in the region of the patient's palpable lump measuring 10 x 6 x 11 mm. No axillary adenopathy on the right.  Accordingly on 02/22/2019 she proceeded to biopsy of the right breast area in question. The pathology from this procedure showed (02/22/2019): invasive ductal carcinoma, 11 o'clock. Prognostic indicators significant for: estrogen receptor, 100% positive and progesterone receptor, 100% positive, both with strong staining intensity. Proliferation marker Ki67 at 2%. HER2 negative (1+).   [name] then underwent a right breast lumpectomy on 03/06/2019. The pathology from this procedure showed ([id number]): 1. Breast, lumpectomy, right - Invasive ductal carcinoma, nottingham grade 1 of 3, 0.8 cm - Ductal carcinoma in situ, intermediate grade - Calcifications associated with carcinoma - Carcinoma extends to the anterior margin -Usual ductal hyperplasia, adenosis and fibrocystic  changes 2. Lymph nodes, sentinel, biopsy, right axillary (8) - No carcinoma identified  (0/8)  The patient's subsequent history is as detailed below.   PAST MEDICAL HISTORY: Past Medical History:  Diagnosis Date   Eczema    Endometriosis    GERD (gastroesophageal reflux disease)    HSV-1 (herpes simplex virus 1) infection    h/o cold sores     PAST SURGICAL HISTORY: Past Surgical History:  Procedure Laterality Date   AUGMENTATION MAMMAPLASTY Bilateral 2010   BREAST BIOPSY     BREAST ENHANCEMENT SURGERY     dental implants     HERNIA REPAIR     PARTIAL MASTECTOMY WITH AXILLARY SENTINEL LYMPH NODE BIOPSY Right 03/06/2019   Procedure: RIGHT BREAST PARTIAL MASTECTOMY WITH SENTINEL LYMPH NODE BIOPSY;  Surgeon: Coralie Keens, MD;  Location: [facility];  Service: General;  Laterality: Right;  Caesarian.  Blepharoplasty   FAMILY HISTORY: Family History  Problem Relation Age of Onset   Lung cancer Father    [name]'s father died from lung cancer at age 59; he was a smoker. [name] is 36 as of 03/12/2019. The patient has no siblings. Patient denies anyone in her family having ovarian, prostate, or pancreatic cancer. Anshu does have a paternal aunt who was diagnosed with breast cancer, but the patient does not know at what age.    GYNECOLOGIC HISTORY:  Patient's last menstrual period was 02/03/2011. 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 Her significant other, Mali Hall, oversees a distribution company that manufactures foam. Ann Mckenzie's daughter, [name] age 95, graduated from [employer] with a degree in Business with a minor in Marketing in 04/2019. [name] currently lives at home with Ann Mckenzie and works part time at [employer].  ADVANCED DIRECTIVES: [name] was given the appropriate forms on 03/12/19 to fill out and return at their  own discretion.  Her ex-husband Tanja Port is currently her healthcare power of attorney.  He works as a Engineer, structural in [employer].   HEALTH MAINTENANCE: Social History   Tobacco Use   Smoking status: Former Smoker   Smokeless tobacco: Never Used  Substance Use Topics   Alcohol use: Yes    Comment: socially   Drug use: Never    Colonoscopy: no  PAP: up to date  Bone density: no Mammography: up to date  No Known Allergies  Current Outpatient Medications  Medication Sig Dispense Refill   estradiol (VIVELLE-DOT) 0.0375 MG/24HR Place 1 patch onto the skin 2 (two) times a week.     oxyCODONE (OXY IR/ROXICODONE) 5 MG immediate release tablet Take 1 tablet (5 mg total) by mouth every 6 (six) hours as needed for moderate pain or severe pain. (Patient not taking: Reported on 03/12/2019) 30 tablet 0   valACYclovir (VALTREX) 1000 MG tablet valacyclovir 1 gram tablet     No current facility-administered medications for this visit.      OBJECTIVE: Middle-aged white woman in no acute distress  Vitals:   06/18/19 1419  BP: (!) 99/57  Pulse: 85  Resp: 18  Temp: 99.1 F (37.3 C)  SpO2: 100%     Body mass index is 16.84 kg/m.   Wt Readings from Last 3 Encounters:  06/18/19 96 lb 9.6 oz (43.8 kg)  03/20/19 100 lb (45.4 kg)  03/12/19  102 lb 6.4 oz (46.4 kg)      ECOG FS:1 - Symptomatic but completely ambulatory  Sclerae unicteric, EOMs intact Oropharynx clear and moist No cervical or supraclavicular adenopathy Lungs no rales or rhonchi Heart regular rate and rhythm Abd soft, nontender, positive bowel sounds MSK no focal spinal tenderness, no upper extremity lymphedema Neuro: nonfocal, well oriented, appropriate affect Breasts: Right breast is status post lumpectomy and radiation.  The cosmetic result is excellent.  There is minimal dry desquamation in the inframammary fold.  There is no evidence of residual or recurrent disease.  The left breast is benign.  Both  axillae are benign.  LAB RESULTS:  CMP  No results found for: NA, K, CL, CO2, GLUCOSE, BUN, CREATININE, CALCIUM, PROT, ALBUMIN, AST, ALT, ALKPHOS, BILITOT, GFRNONAA, GFRAA  No results found for: TOTALPROTELP, ALBUMINELP, A1GS, A2GS, BETS, BETA2SER, GAMS, MSPIKE, SPEI  No results found for: KPAFRELGTCHN, LAMBDASER, KAPLAMBRATIO  No results found for: WBC, NEUTROABS, HGB, HCT, MCV, PLT  '@LASTCHEMISTRY' @  No results found for: LABCA2  No components found for: GQQPYP950  No results for input(s): INR in the last 168 hours.  No results found for: LABCA2  No results found for: DTO671  No results found for: IWP809  No results found for: XIP382  No results found for: CA2729  No components found for: HGQUANT  No results found for: CEA1 / No results found for: CEA1   No results found for: AFPTUMOR  No results found for: CHROMOGRNA  No results found for: PSA1  No visits with results within 3 Day(s) from this visit.  Latest known visit with results is:  No results found for any previous visit.    (this displays the last labs from the last 3 days)  No results found for: TOTALPROTELP, ALBUMINELP, A1GS, A2GS, BETS, BETA2SER, GAMS, MSPIKE, SPEI (this displays SPEP labs)  No results found for: KPAFRELGTCHN, LAMBDASER, KAPLAMBRATIO (kappa/lambda light chains)  No results found for: HGBA, HGBA2QUANT, HGBFQUANT, HGBSQUAN (Hemoglobinopathy evaluation)   No results found for: LDH  No results found for: IRON, TIBC, IRONPCTSAT (Iron and TIBC)  No results found for: FERRITIN  Urinalysis No results found for: COLORURINE, APPEARANCEUR, LABSPEC, PHURINE, GLUCOSEU, HGBUR, BILIRUBINUR, KETONESUR, PROTEINUR, UROBILINOGEN, NITRITE, LEUKOCYTESUR   STUDIES:  No results found.   ELIGIBLE FOR AVAILABLE RESEARCH PROTOCOL: no   ASSESSMENT: 48 y.o. [place] woman status post right lumpectomy and sentinel lymph node sampling 03/06/2019 for a pT1b pN0, stage IA invasive ductal  carcinoma, grade 1, estrogen and progesterone receptor strongly positive, HER-2 not amplified, with an MIB-1 of 2%.  (a) a total of 8 lymph nodes were removed  (b) the anterior margin was broadly positive--this requires no further surgery  (1) Oncotype DX score of 13 predicts a 9-year risk of recurrence outside the breast of 4% if the patient's only systemic therapy is an antiestrogen for 5 years.  It also predicts no significant benefit from tamoxifen.  (2) adjuvant radiation 04/25/2019 - 06/12/2019  (a) Right Breast / 50.4 Gy in 28 fractions  (b) Boost / 12 Gy in 6 fractions  (3) antiestrogens to follow, likely tamoxifen  (4) genetics testing to be scheduled   PLAN: Danee did remarkably well with her surgery and radiation.  She has a good understanding of her prognosis through the Oncotype test, and is now ready to consider antiestrogens.  We discussed the difference between tamoxifen and anastrozole in detail. She understands that anastrozole and the aromatase inhibitors in general work by blocking estrogen production. Accordingly  vaginal dryness, decrease in bone density, and of course hot flashes can result. The aromatase inhibitors can also negatively affect the cholesterol profile, although that is a minor effect. One out of 5 women on aromatase inhibitors we will feel "old and achy". This arthralgia/myalgia syndrome, which resembles fibromyalgia clinically, does resolve with stopping the medications. Accordingly this is not a reason to not try an aromatase inhibitor but it is a frequent reason to stop it (in other words 20% of women will not be able to tolerate these medications).  Tamoxifen on the other hand does not block estrogen production. It does not "take away a woman's estrogen". It blocks the estrogen receptor in breast cells. Like anastrozole, it can also cause hot flashes. As opposed to anastrozole, tamoxifen has many estrogen-like effects. It is technically an estrogen receptor  modulator. This means that in some tissues tamoxifen works like estrogen-- for example it helps strengthen the bones. It tends to improve the cholesterol profile. It can cause thickening of the endometrial lining, and even endometrial polyps or rarely cancer of the uterus.(The risk of uterine cancer due to tamoxifen is one additional cancer per thousand women year). It can cause vaginal wetness or stickiness. It can cause blood clots through this estrogen-like effect--the risk of blood clots with tamoxifen is exactly the same as with birth control pills or hormone replacement.  Neither of these agents causes mood changes or weight gain, despite the popular belief that they can have these side effects. We have data from studies comparing either of these drugs with placebo, and in those cases the control group had the same amount of weight gain and depression as the group that took the drug.  She is going to start tamoxifen beginning 07/02/2019.  She will see me again in early November to make sure she is tolerating it well.  She will then have mammography in February.  If she wishes to have Estring or other vaginal estrogens she will let me know  She knows to call for any other issue that may develop before her next visit here.    Raju Coppolino, Virgie Dad, MD  06/18/19 2:51 PM Medical Oncology and Hematology [facility] [address] [place], [place] [address] Tel. [phone]    Fax. [phone]    I, Wilburn Mylar, am acting as scribe for Dr. Virgie Dad. Manraj Yeo.  I, Lurline Del MD, have reviewed the above documentation for accuracy and completeness, and I agree with the above.

## 2019-06-19 ENCOUNTER — Telehealth: Payer: Self-pay | Admitting: Oncology

## 2019-06-19 NOTE — Telephone Encounter (Signed)
I left a message regarding schedule I will mail °

## 2019-06-28 ENCOUNTER — Telehealth: Payer: Self-pay | Admitting: Adult Health

## 2019-06-28 NOTE — Telephone Encounter (Signed)
Scheduled appt per 7/09 sch message - mailed letter with appt date and time

## 2019-07-08 ENCOUNTER — Other Ambulatory Visit: Payer: Self-pay | Admitting: *Deleted

## 2019-07-08 MED ORDER — TAMOXIFEN CITRATE 20 MG PO TABS: 20.0000 mg | ORAL_TABLET | Freq: Every day | ORAL | 3 refills | Status: AC

## 2019-07-18 ENCOUNTER — Other Ambulatory Visit: Payer: Self-pay

## 2019-07-18 ENCOUNTER — Encounter: Payer: Self-pay | Admitting: Radiation Oncology

## 2019-07-18 ENCOUNTER — Ambulatory Visit
Admission: RE | Admit: 2019-07-18 | Discharge: 2019-07-18 | Disposition: A | Discharge status: Home / Self Care | Payer: BC Managed Care – PPO | Source: Ambulatory Visit | Attending: Radiation Oncology | Admitting: Radiation Oncology

## 2019-07-18 VITALS — BP 103/65 | HR 83 | Temp 97.5°F | Resp 18 | Ht 63.5 in | Wt 96.4 lb

## 2019-07-18 DIAGNOSIS — Z79810 Long term (current) use of selective estrogen receptor modulators (SERMs): Secondary | ICD-10-CM | POA: Insufficient documentation

## 2019-07-18 DIAGNOSIS — Z923 Personal history of irradiation: Secondary | ICD-10-CM | POA: Diagnosis not present

## 2019-07-18 DIAGNOSIS — C50411 Malignant neoplasm of upper-outer quadrant of right female breast: Secondary | ICD-10-CM | POA: Insufficient documentation

## 2019-07-18 DIAGNOSIS — Z170 Estrogen receptor positive status [ER+]: Secondary | ICD-10-CM | POA: Insufficient documentation

## 2019-07-18 NOTE — Progress Notes (Signed)
  Radiation Oncology         [phone] ________________________________  Name: Ann Mckenzie MRN: [id number]  Date: 07/18/2019  DOB: August 31, 1971  Follow-Up Visit Note  CC: Blair Heys, PA-C  Magrinat, Virgie Dad, MD    ICD-10-CM   1. Malignant neoplasm of upper-outer quadrant of right breast in female, estrogen receptor positive ([place])  C50.411    Z17.0     Diagnosis:   47 y.o. female with StageIA (pT1b, pN0) RightBreast UOQ Invasive Ductal Carcinoma, ER+/ PR+/ Her2-, GradeI     Interval Since Last Radiation:  5 weeks   Radiation treatment dates:   04/25/19-06/12/19  Site/dose:   1. Right Breast / 50.4 Gy in 28 fractions of 1.8 Gy 2. Boost / 12 Gy in 6 fractions of 2 Gy  Narrative:  The patient returns today for routine follow-up.  She reports her skin is healed. No issues or complaints. She started tamoxifen on July 16th.                              ALLERGIES:  has No Known Allergies.  Meds: Current Outpatient Medications  Medication Sig Dispense Refill  . oxyCODONE (OXY IR/ROXICODONE) 5 MG immediate release tablet Take 1 tablet (5 mg total) by mouth every 6 (six) hours as needed for moderate pain or severe pain. (Patient not taking: Reported on 03/12/2019) 30 tablet 0  . tamoxifen (NOLVADEX) 20 MG tablet Take 1 tablet (20 mg total) by mouth daily. 30 tablet 3  . valACYclovir (VALTREX) 1000 MG tablet valacyclovir 1 gram tablet     No current facility-administered medications for this encounter.     Physical Findings: The patient is in no acute distress. Patient is alert and oriented.  height is 5' 3.5" (1.613 m) and weight is 96 lb 6 oz (43.7 kg). Her temporal temperature is 97.5 F (36.4 C) (abnormal). Her blood pressure is 103/65 and her pulse is 83. Her respiration is 18 and oxygen saturation is 100%.   Lungs are clear to auscultation bilaterally. Heart has regular rate and rhythm. No palpable cervical, supraclavicular, or axillary adenopathy. Abdomen soft,  non-tender, normal bowel sounds.  Breast Exam Left Breast: No palpable mass, nipple discharge or bleeding. Cosmetic implant in place. Right Breast: Skin is healed extremely well with just some faint hyperpigmentation changes. No palpable mass, nipple discharge or bleeding. Cosmetic implant in place.  Lab Findings: No results found for: WBC, HGB, HCT, MCV, PLT  Radiographic Findings: No results found.  Impression:  StageIA (pT1b, pN0) RightBreast UOQ Invasive Ductal Carcinoma, ER+/ PR+/ Her2-, GradeI.   Clinically stable. No evidence of recurrence on clinical exam. Patient is tolerating tamoxifen well thus far.  Plan:  Routine follow-up in 3 months which will likely be the patient's last follow-up in radiation oncology. She will continue long-term follow-up with medical oncology and surgery.   ____________________________________  Blair Promise, PhD, MD  This document serves as a record of services personally performed by Gery Pray, MD. It was created on his behalf by Rae Lips, a trained medical scribe. The creation of this record is based on the scribe's personal observations and the provider's statements to them. This document has been checked and approved by the attending provider.

## 2019-07-18 NOTE — Progress Notes (Signed)
Patient in for follow up. Skin is healed. No issues or complaints of.Started tamoxfan on July 16.BP 103/65 (BP Location: Left Arm, Patient Position: Standing)   Pulse 83   Temp (!) 97.5 F (36.4 C) (Temporal)   Resp 18   Ht 5' 3.5" (1.613 m)   Wt 96 lb 6 oz (43.7 kg)   LMP 02/03/2011   SpO2 100%   BMI 16.80 kg/m

## 2019-07-18 NOTE — Patient Instructions (Signed)
Coronavirus (COVID-19) Are you at risk?  Are you at risk for the Coronavirus (COVID-19)?  To be considered HIGH RISK for Coronavirus (COVID-19), you have to meet the following criteria:  . Traveled to [place], [place], [place], [place] or [place]; or in the [place] to [place], [place], [place], or [place]; and have fever, cough, and shortness of breath within the last 2 weeks of travel OR . Been in close contact with a person diagnosed with COVID-19 within the last 2 weeks and have fever, cough, and shortness of breath . IF YOU DO NOT MEET THESE CRITERIA, YOU ARE CONSIDERED LOW RISK FOR COVID-19.  What to do if you are HIGH RISK for COVID-19?  . If you are having a medical emergency, call 911. . Seek medical care right away. Before you go to a doctor's office, urgent care or emergency department, call ahead and tell them about your recent travel, contact with someone diagnosed with COVID-19, and your symptoms. You should receive instructions from your physician's office regarding next steps of care.  . When you arrive at healthcare provider, tell the healthcare staff immediately you have returned from visiting [place], [place], [place], [place] or [place]; or traveled in the [place] to [place], [place], [place], or [place]; in the last two weeks or you have been in close contact with a person diagnosed with COVID-19 in the last 2 weeks.   . Tell the health care staff about your symptoms: fever, cough and shortness of breath. . After you have been seen by a medical provider, you will be either: o Tested for (COVID-19) and discharged home on quarantine except to seek medical care if symptoms worsen, and asked to  - Stay home and avoid contact with others until you get your results (4-5 days)  - Avoid travel on public transportation if possible (such as bus, train, or airplane) or o Sent to the Emergency Department by EMS for evaluation, COVID-19 testing, and possible  admission depending on your condition and test results.  What to do if you are LOW RISK for COVID-19?  Reduce your risk of any infection by using the same precautions used for avoiding the common cold or flu:  . Wash your hands often with soap and warm water for at least 20 seconds.  If soap and water are not readily available, use an alcohol-based hand sanitizer with at least 60% alcohol.  . If coughing or sneezing, cover your mouth and nose by coughing or sneezing into the elbow areas of your shirt or coat, into a tissue or into your sleeve (not your hands). . Avoid shaking hands with others and consider head nods or verbal greetings only. . Avoid touching your eyes, nose, or mouth with unwashed hands.  . Avoid close contact with people who are sick. . Avoid places or events with large numbers of people in one location, like concerts or sporting events. . Carefully consider travel plans you have or are making. . If you are planning any travel outside or inside the US, visit the CDC's Travelers' Health webpage for the latest health notices. . If you have some symptoms but not all symptoms, continue to monitor at home and seek medical attention if your symptoms worsen. . If you are having a medical emergency, call 911.   ADDITIONAL HEALTHCARE OPTIONS FOR PATIENTS  [facility] / e-Visit: [email or web address]         [facility]: [phone]  [facility]: [phone]                   [facility]: [phone]   

## 2019-10-17 ENCOUNTER — Ambulatory Visit: Payer: Self-pay | Admitting: Radiation Oncology

## 2019-10-23 NOTE — Progress Notes (Signed)
[facility]  [phone] [phone]    ID: Ann Mckenzie DOB: 10-17-1971  MR#: [id number]  PJK#:[id number]  Patient Care Team: Ann Mckenzie, Ann Mckenzie as PCP - General (Physician Assistant) Ann Nigh, MD as Consulting Physician (Obstetrics and Gynecology) Ann Mckenzie, Ann Dad, MD as Consulting Physician (Oncology) Ann Keens, MD as Consulting Physician (General Surgery) Ann Pray, MD as Consulting Physician (Radiation Oncology) OTHER MD:   CHIEF COMPLAINT: Estrogen receptor positive breast cancer  CURRENT TREATMENT: Tamoxifen   INTERVAL HISTORY: Ann Mckenzie returns today for follow up and treatment of her estrogen receptor positive breast cancer.  She was prescribed tamoxifen at her last visit here on 06/18/2019.  The main issue she is having with this is the hot flashes.  They are making her miserable.  They can be in the evening and they can be in the morning.  It can happen during the day.  They make her sweat.  She is having no problems with vaginal wetness.  Since her last visit, she has not undergone any additional studies. She will be due for repeat mammography in late February or early March 2021.   REVIEW OF SYSTEMS: Ann Mckenzie is exercising regularly walking 2 miles at least 4 days a week.  She is taking appropriate pandemic precautions.  A detailed review of systems today is otherwise stable.   HISTORY OF CURRENT ILLNESS: Ann Mckenzie has a history of breast calcifications.   Ann Mckenzie underwent routine mammography showing abnormal results. Then, on 09/15/2017 underwent bilateral mammography with tomography at the breast center showing breast density to be category BC.  There were 2 new groups of coarse heterogeneous calcifications in the upper outer and upper inner right breast, measuring 0.3 and 0.8 cm respectively.  Other groups of calcifications were stable.  Biopsy of these 2 groups on 09/22/2017 showed ([id number]) sclerosing  adenosis, but no evidence of carcinoma.  More recently she again noted a change in the right breast. She underwent bilateral diagnostic mammography with tomography and right breast ultrasonography at [facility] on 02/20/2019 showing: Breast Density Category C. There is increased density in the region the patient's palpable lump best seen on the tangential view. No other suspicious findings are seen in either breast. On physical exam, there is a palpable lump in the right breast at 11 o'clock. Targeted ultrasound is performed, showing an ill-defined hypoechoic mass at 11 o'clock, 2 cm from the nipple in the region of the patient's palpable lump measuring 10 x 6 x 11 mm. No axillary adenopathy on the right.  Accordingly on 02/22/2019 she proceeded to biopsy of the right breast area in question. The pathology from this procedure showed (02/22/2019): invasive ductal carcinoma, 11 o'clock. Prognostic indicators significant for: estrogen receptor, 100% positive and progesterone receptor, 100% positive, both with strong staining intensity. Proliferation marker Ki67 at 2%. HER2 negative (1+).   Ann Mckenzie then underwent a right breast lumpectomy on 03/06/2019. The pathology from this procedure showed ([id number]): 1. Breast, lumpectomy, right - Invasive ductal carcinoma, nottingham grade 1 of 3, 0.8 cm - Ductal carcinoma in situ, intermediate grade - Calcifications associated with carcinoma - Carcinoma extends to the anterior margin -Usual ductal hyperplasia, adenosis and fibrocystic changes 2. Lymph nodes, sentinel, biopsy, right axillary (8) - No carcinoma identified (0/8)  The patient's subsequent history is as detailed below.   PAST MEDICAL HISTORY: Past Medical History:  Diagnosis Date  . Eczema   . Endometriosis   . GERD (gastroesophageal reflux disease)   . 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 AUGMENTATION MAMMAPLASTY Bilateral 2010  . BREAST BIOPSY    . BREAST ENHANCEMENT SURGERY    . dental implants    . HERNIA REPAIR    . PARTIAL MASTECTOMY WITH AXILLARY SENTINEL LYMPH NODE BIOPSY Right 03/06/2019   Procedure: RIGHT BREAST PARTIAL MASTECTOMY WITH SENTINEL LYMPH NODE BIOPSY;  Surgeon: Mckenzie, Douglas, MD;  Location: [facility];  Service: General;  Laterality: Right;  Caesarian.  Blepharoplasty   FAMILY HISTORY: Family History  Problem Relation Age of Onset  . Lung cancer Father    Ann Mckenzie's father died from lung cancer at age 61; he was a smoker. [name] is 73 as of 03/12/2019. The patient has no siblings. Patient denies anyone in her family having ovarian, prostate, or pancreatic cancer. Ann Mckenzie does have a paternal aunt who was diagnosed with breast cancer, but the patient does not know at what age.    GYNECOLOGIC HISTORY:  Patient's last menstrual period was 02/03/2011. 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 Ann Mckenzie's daughter, Ann Mckenzie age 22, graduated from [employer] with a degree in Business with a minor in Marketing in 04/2019. Ann Mckenzie currently lives at home with Ann Mckenzie and works part time at a salon.  ADVANCED DIRECTIVES: Ann Mckenzie was given the appropriate forms on 03/12/19 to fill out and return at their own discretion.  Her ex-husband Ann Mckenzie is currently her healthcare power of attorney.  He works as a police officer in [place].   HEALTH MAINTENANCE: Social History   Tobacco Use  . Smoking status: Former Smoker  . Smokeless tobacco: Never Used  Substance Use Topics  . Alcohol use: Yes    Comment: socially  .  Drug use: Never    Colonoscopy: no  PAP: up to date  Bone density: no Mammography: up to date  No Known Allergies  Current Outpatient Medications  Medication Sig Dispense Refill  . gabapentin (NEURONTIN) 300 MG capsule Take 1 capsule (300 mg total) by mouth at bedtime. 90 capsule 4  . oxyCODONE (OXY IR/ROXICODONE) 5 MG immediate release tablet Take 1 tablet (5 mg total) by mouth every 6 (six) hours as needed for moderate pain or severe pain. (Patient not taking: Reported on 03/12/2019) 30 tablet 0  . tamoxifen (NOLVADEX) 20 MG tablet Take 1 tablet (20 mg total) by mouth daily. 30 tablet 3  . valACYclovir (VALTREX) 1000 MG tablet valacyclovir 1 gram tablet     No current facility-administered medications for this visit.      OBJECTIVE: Middle-aged white woman who appears well  Vitals:   10/24/19 1011  BP: 103/61  Pulse: 63  Resp: 18  Temp: 98.2 F (36.8 C)  SpO2: 100%     Body mass index is 17.44 kg/m.   Wt Readings from Last 3 Encounters:  10/24/19 100 lb (45.4 kg)  07/18/19 96 lb 6 oz (43.7 kg)  06/18/19 96 lb 9.6 oz (43.8 kg)      ECOG FS:1 - Symptomatic but completely ambulatory  Sclerae unicteric, EOMs intact Wearing a mask No cervical or supraclavicular adenopathy Lungs no rales   or rhonchi Heart regular rate and rhythm Abd soft, nontender, positive bowel sounds MSK no focal spinal tenderness, no upper extremity lymphedema Neuro: nonfocal, well oriented, appropriate affect Breasts: Status post right lumpectomy followed by radiation, with no evidence of disease recurrence.  Left breast is benign.  Both axillae are benign.   LAB RESULTS:  CMP     Component Value Date/Time   NA 140 10/24/2019 0941   K 4.3 10/24/2019 0941   CL 104 10/24/2019 0941   CO2 25 10/24/2019 0941   GLUCOSE 80 10/24/2019 0941   BUN 15 10/24/2019 0941   CREATININE 0.75 10/24/2019 0941   CALCIUM 9.5 10/24/2019 0941   PROT 7.1 10/24/2019 0941   ALBUMIN 4.5 10/24/2019 0941   AST 20  10/24/2019 0941   ALT 19 10/24/2019 0941   ALKPHOS 56 10/24/2019 0941   BILITOT 0.3 10/24/2019 0941   GFRNONAA >60 10/24/2019 0941   GFRAA >60 10/24/2019 0941    No results found for: TOTALPROTELP, ALBUMINELP, A1GS, A2GS, BETS, BETA2SER, GAMS, MSPIKE, SPEI  No results found for: KPAFRELGTCHN, LAMBDASER, KAPLAMBRATIO  Lab Results  Component Value Date   WBC 3.6 (L) 10/24/2019   NEUTROABS 2.2 10/24/2019   HGB 14.3 10/24/2019   HCT 41.9 10/24/2019   MCV 100.5 (H) 10/24/2019   PLT 260 10/24/2019    @LASTCHEMISTRY@  No results found for: LABCA2  No components found for: LABCAN125  No results for input(s): INR in the last 168 hours.  No results found for: LABCA2  No results found for: CAN199  No results found for: CAN125  No results found for: CAN153  No results found for: CA2729  No components found for: HGQUANT  No results found for: CEA1 / No results found for: CEA1   No results found for: AFPTUMOR  No results found for: CHROMOGRNA  No results found for: PSA1  Appointment on 10/24/2019  Component Date Value Ref Range Status  . Sodium 10/24/2019 140  135 - 145 mmol/L Final  . Potassium 10/24/2019 4.3  3.5 - 5.1 mmol/L Final  . Chloride 10/24/2019 104  98 - 111 mmol/L Final  . CO2 10/24/2019 25  22 - 32 mmol/L Final  . Glucose, Bld 10/24/2019 80  70 - 99 mg/dL Final  . BUN 10/24/2019 15  6 - 20 mg/dL Final  . Creatinine 10/24/2019 0.75  0.44 - 1.00 mg/dL Final  . Calcium 10/24/2019 9.5  8.9 - 10.3 mg/dL Final  . Total Protein 10/24/2019 7.1  6.5 - 8.1 g/dL Final  . Albumin 10/24/2019 4.5  3.5 - 5.0 g/dL Final  . AST 10/24/2019 20  15 - 41 U/L Final  . ALT 10/24/2019 19  0 - 44 U/L Final  . Alkaline Phosphatase 10/24/2019 56  38 - 126 U/L Final  . Total Bilirubin 10/24/2019 0.3  0.3 - 1.2 mg/dL Final  . GFR, Est Non Af Am 10/24/2019 >60  >60 mL/min Final  . GFR, Est AFR Am 10/24/2019 >60  >60 mL/min Final  . Anion gap 10/24/2019 11  5 - 15 Final    Performed at [facility], [address] [place], [place] [address]  . WBC Count 10/24/2019 3.6* 4.0 - 10.5 K/uL Final  . RBC 10/24/2019 4.17  3.87 - 5.11 MIL/uL Final  . Hemoglobin 10/24/2019 14.3  12.0 - 15.0 g/dL Final  . HCT 10/24/2019 41.9  36.0 - 46.0 % Final  . MCV 10/24/2019 100.5* 80.0 - 100.0 fL Final  . MCH 10/24/2019 34.3*   26.0 - 34.0 pg Final  . MCHC 10/24/2019 34.1  30.0 - 36.0 g/dL Final  . RDW 10/24/2019 12.3  11.5 - 15.5 % Final  . Platelet Count 10/24/2019 260  150 - 400 K/uL Final  . nRBC 10/24/2019 0.0  0.0 - 0.2 % Final  . Neutrophils Relative % 10/24/2019 61  % Final  . Neutro Abs 10/24/2019 2.2  1.7 - 7.7 K/uL Final  . Lymphocytes Relative 10/24/2019 27  % Final  . Lymphs Abs 10/24/2019 1.0  0.7 - 4.0 K/uL Final  . Monocytes Relative 10/24/2019 10  % Final  . Monocytes Absolute 10/24/2019 0.4  0.1 - 1.0 K/uL Final  . Eosinophils Relative 10/24/2019 1  % Final  . Eosinophils Absolute 10/24/2019 0.0  0.0 - 0.5 K/uL Final  . Basophils Relative 10/24/2019 1  % Final  . Basophils Absolute 10/24/2019 0.0  0.0 - 0.1 K/uL Final  . Immature Granulocytes 10/24/2019 0  % Final  . Abs Immature Granulocytes 10/24/2019 0.01  0.00 - 0.07 K/uL Final   Performed at [facility], [address] [place], [place] [address]    (this displays the last labs from the last 3 days)  No results found for: TOTALPROTELP, ALBUMINELP, A1GS, A2GS, BETS, BETA2SER, GAMS, MSPIKE, SPEI (this displays SPEP labs)  No results found for: KPAFRELGTCHN, LAMBDASER, KAPLAMBRATIO (kappa/lambda light chains)  No results found for: HGBA, HGBA2QUANT, HGBFQUANT, HGBSQUAN (Hemoglobinopathy evaluation)   No results found for: LDH  No results found for: IRON, TIBC, IRONPCTSAT (Iron and TIBC)  No results found for: FERRITIN  Urinalysis No results found for: COLORURINE, APPEARANCEUR, LABSPEC, PHURINE, GLUCOSEU, HGBUR, BILIRUBINUR, KETONESUR,  PROTEINUR, UROBILINOGEN, NITRITE, LEUKOCYTESUR   STUDIES:  No results found.   ELIGIBLE FOR AVAILABLE RESEARCH PROTOCOL: no   ASSESSMENT: 48 y.o. [place] woman status post right lumpectomy and sentinel lymph node sampling 03/06/2019 for a pT1b pN0, stage IA invasive ductal carcinoma, grade 1, estrogen and progesterone receptor strongly positive, HER-2 not amplified, with an MIB-1 of 2%.  (a) a total of 8 lymph nodes were removed  (b) the anterior margin was broadly positive--this requires no further surgery  (1) Oncotype DX score of 13 predicts a 9-year risk of recurrence outside the breast of 4% if the patient's only systemic therapy is an antiestrogen for 5 years.  It also predicts no significant benefit from tamoxifen.  (2) adjuvant radiation 04/25/2019 - 06/12/2019  (a) Right Breast / 50.4 Gy in 28 fractions  (b) Boost / 12 Gy in 6 fractions  (3) antiestrogens to follow, likely tamoxifen  (4) genetics testing to be scheduled   PLAN: Ann Mckenzie is doing fine in general but having significant hot flashes from the tamoxifen.  We discussed this at length today.  She is going to start gabapentin at bedtime and I wrote her that prescription.  I am hopeful that will help her sleep better, and sleep longer.  I have asked her to call us the first week in December and let us know how her daytime hot flashes are doing.  If they are still a bother I will call in venlafaxine 37.5 mg XR at that time and then asked her to call us again a month later to see if we need to go to 75 mg.  Hopefully with these maneuvers she will feel more comfortable.  She will have her next mammogram at the breast center in March and she will see me again in April.  If all goes well at   that point we will start seeing her on a once a year basis.  Magrinat, Gustav C, MD  10/24/19 10:56 AM Medical Oncology and Hematology [facility] [address] [place], [place] [address] Tel. [phone]    Fax.  [phone]    I, Katie Daubenspeck, am acting as scribe for Dr. Gustav C. Magrinat.  I, Gustav Magrinat MD, have reviewed the above documentation for accuracy and completeness, and I agree with the above.   

## 2019-10-24 ENCOUNTER — Ambulatory Visit
Admission: RE | Admit: 2019-10-24 | Discharge: 2019-10-24 | Disposition: A | Discharge status: Home / Self Care | Payer: BC Managed Care – PPO | Source: Ambulatory Visit | Attending: Radiation Oncology | Admitting: Radiation Oncology

## 2019-10-24 ENCOUNTER — Other Ambulatory Visit: Payer: Self-pay

## 2019-10-24 ENCOUNTER — Inpatient Hospital Stay: Payer: BC Managed Care – PPO | Attending: Oncology | Admitting: Oncology

## 2019-10-24 ENCOUNTER — Telehealth: Payer: Self-pay | Admitting: Oncology

## 2019-10-24 ENCOUNTER — Inpatient Hospital Stay: Payer: BC Managed Care – PPO

## 2019-10-24 VITALS — BP 103/61 | HR 63 | Temp 98.2°F | Resp 18 | Ht 63.5 in | Wt 100.0 lb

## 2019-10-24 DIAGNOSIS — C50411 Malignant neoplasm of upper-outer quadrant of right female breast: Secondary | ICD-10-CM

## 2019-10-24 DIAGNOSIS — Z923 Personal history of irradiation: Secondary | ICD-10-CM | POA: Diagnosis not present

## 2019-10-24 DIAGNOSIS — Z803 Family history of malignant neoplasm of breast: Secondary | ICD-10-CM | POA: Insufficient documentation

## 2019-10-24 DIAGNOSIS — Z170 Estrogen receptor positive status [ER+]: Secondary | ICD-10-CM | POA: Diagnosis not present

## 2019-10-24 DIAGNOSIS — Z79899 Other long term (current) drug therapy: Secondary | ICD-10-CM | POA: Diagnosis not present

## 2019-10-24 DIAGNOSIS — C9000 Multiple myeloma not having achieved remission: Secondary | ICD-10-CM

## 2019-10-24 DIAGNOSIS — Z801 Family history of malignant neoplasm of trachea, bronchus and lung: Secondary | ICD-10-CM | POA: Insufficient documentation

## 2019-10-24 DIAGNOSIS — Z87891 Personal history of nicotine dependence: Secondary | ICD-10-CM | POA: Diagnosis not present

## 2019-10-24 DIAGNOSIS — Z79810 Long term (current) use of selective estrogen receptor modulators (SERMs): Secondary | ICD-10-CM | POA: Insufficient documentation

## 2019-10-24 LAB — CMP (CANCER CENTER ONLY)
ALT: 19 U/L (ref 0–44)
AST: 20 U/L (ref 15–41)
Albumin: 4.5 g/dL (ref 3.5–5.0)
Alkaline Phosphatase: 56 U/L (ref 38–126)
Anion gap: 11 (ref 5–15)
BUN: 15 mg/dL (ref 6–20)
CO2: 25 mmol/L (ref 22–32)
Calcium: 9.5 mg/dL (ref 8.9–10.3)
Chloride: 104 mmol/L (ref 98–111)
Creatinine: 0.75 mg/dL (ref 0.44–1.00)
GFR, Est AFR Am: >60 mL/min (ref >60)
GFR, Estimated: >60 mL/min (ref >60)
Glucose, Bld: 80 mg/dL (ref 70–99)
Potassium: 4.3 mmol/L (ref 3.5–5.1)
Sodium: 140 mmol/L (ref 135–145)
Total Bilirubin: 0.3 mg/dL (ref 0.3–1.2)
Total Protein: 7.1 g/dL (ref 6.5–8.1)

## 2019-10-24 LAB — CBC WITH DIFFERENTIAL (CANCER CENTER ONLY)
Abs Immature Granulocytes: 0.01 10*3/uL (ref 0.00–0.07)
Basophils Absolute: 0 10*3/uL (ref 0.0–0.1)
Basophils Relative: 1 %
Eosinophils Absolute: 0 10*3/uL (ref 0.0–0.5)
Eosinophils Relative: 1 %
HCT: 41.9 % (ref 36.0–46.0)
Hemoglobin: 14.3 g/dL (ref 12.0–15.0)
Immature Granulocytes: 0 %
Lymphocytes Relative: 27 %
Lymphs Abs: 1 10*3/uL (ref 0.7–4.0)
MCH: 34.3 pg — ABNORMAL HIGH (ref 26.0–34.0)
MCHC: 34.1 g/dL (ref 30.0–36.0)
MCV: 100.5 fL — ABNORMAL HIGH (ref 80.0–100.0)
Monocytes Absolute: 0.4 10*3/uL (ref 0.1–1.0)
Monocytes Relative: 10 %
Neutro Abs: 2.2 10*3/uL (ref 1.7–7.7)
Neutrophils Relative %: 61 %
Platelet Count: 260 10*3/uL (ref 150–400)
RBC: 4.17 MIL/uL (ref 3.87–5.11)
RDW: 12.3 % (ref 11.5–15.5)
WBC Count: 3.6 10*3/uL — ABNORMAL LOW (ref 4.0–10.5)
nRBC: 0 % (ref 0.0–0.2)

## 2019-10-24 MED ORDER — GABAPENTIN 300 MG PO CAPS: 300.0000 mg | ORAL_CAPSULE | Freq: Every day | ORAL | 4 refills | Status: AC

## 2019-10-24 NOTE — Telephone Encounter (Signed)
I could not reach patient regarding schedule  °

## 2019-11-26 ENCOUNTER — Encounter: Payer: Self-pay | Admitting: *Deleted

## 2020-01-30 ENCOUNTER — Inpatient Hospital Stay: Payer: BC Managed Care – PPO | Attending: Oncology | Admitting: Adult Health

## 2020-01-30 ENCOUNTER — Encounter: Payer: Self-pay | Admitting: Adult Health

## 2020-03-19 ENCOUNTER — Other Ambulatory Visit: Payer: Self-pay | Admitting: *Deleted

## 2020-03-19 DIAGNOSIS — Z170 Estrogen receptor positive status [ER+]: Secondary | ICD-10-CM

## 2020-03-19 DIAGNOSIS — C50411 Malignant neoplasm of upper-outer quadrant of right female breast: Secondary | ICD-10-CM

## 2020-03-22 NOTE — Progress Notes (Signed)
No show

## 2020-03-23 ENCOUNTER — Encounter: Payer: Self-pay | Admitting: Oncology

## 2020-03-23 ENCOUNTER — Inpatient Hospital Stay: Payer: BC Managed Care – PPO

## 2020-03-23 ENCOUNTER — Inpatient Hospital Stay: Payer: BC Managed Care – PPO | Attending: Oncology | Admitting: Oncology

## 2020-03-23 DIAGNOSIS — Z170 Estrogen receptor positive status [ER+]: Secondary | ICD-10-CM

## 2020-03-23 DIAGNOSIS — C50411 Malignant neoplasm of upper-outer quadrant of right female breast: Secondary | ICD-10-CM

## 2020-05-05 ENCOUNTER — Other Ambulatory Visit: Payer: Self-pay | Admitting: Oncology

## 2020-05-05 ENCOUNTER — Other Ambulatory Visit: Payer: Self-pay

## 2020-05-05 ENCOUNTER — Ambulatory Visit
Admission: RE | Admit: 2020-05-05 | Discharge: 2020-05-05 | Disposition: A | Discharge status: Home / Self Care | Payer: BC Managed Care – PPO | Source: Ambulatory Visit | Attending: Oncology | Admitting: Oncology

## 2020-05-05 DIAGNOSIS — Z170 Estrogen receptor positive status [ER+]: Secondary | ICD-10-CM

## 2020-08-21 ENCOUNTER — Other Ambulatory Visit: Payer: Self-pay | Admitting: Surgery

## 2020-09-16 IMAGING — [facility] DIGITAL DIAGNOSTIC BILATERAL MAMMOGRAM WITH IMPLANTS, CAD AND TO
8 of 14 series · 8 of 34 positions shown · non-contrast
Comparison: Previous exam(s).

CLINICAL DATA: The patient presents with a palpable lump.

EXAM:
DIGITAL DIAGNOSTIC BILATERAL MAMMOGRAM WITH IMPLANTS, CAD AND TOMO
ULTRASOUND RIGHT BREAST
The patient has retropectoral implants. Standard and implant
displaced views were performed.

[R MLO]
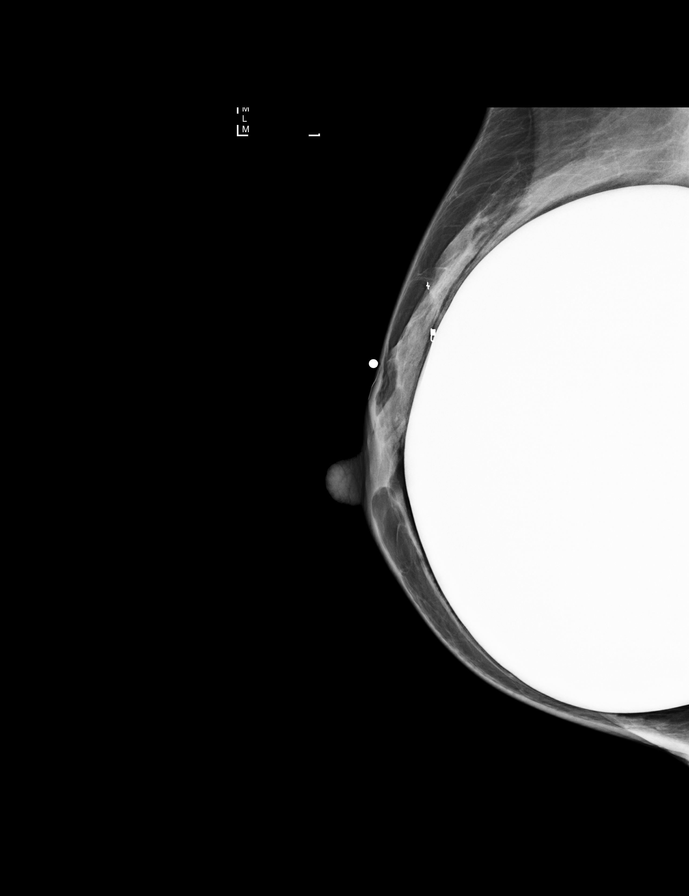

[L MLO]
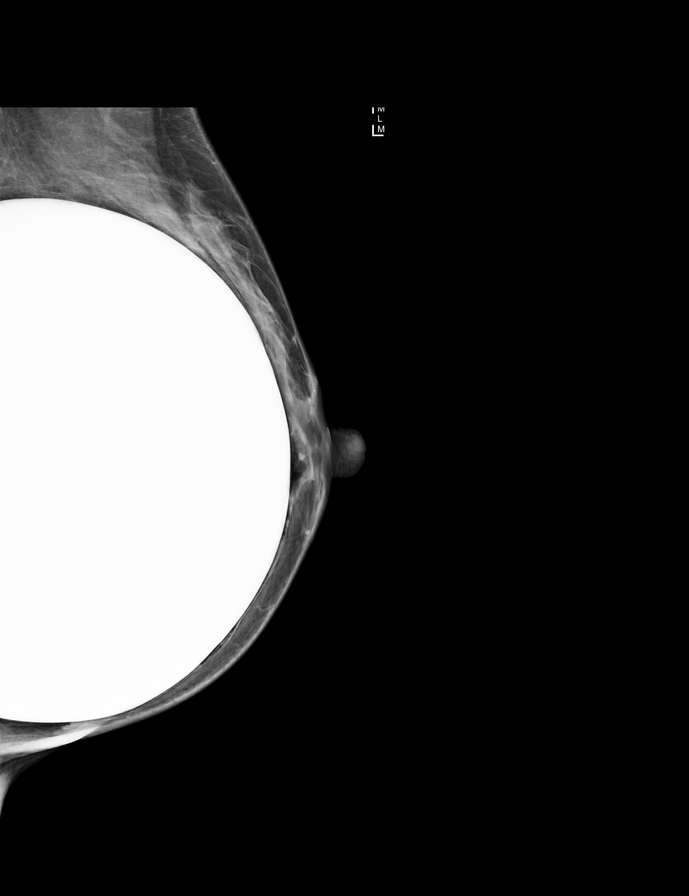

[L CC]
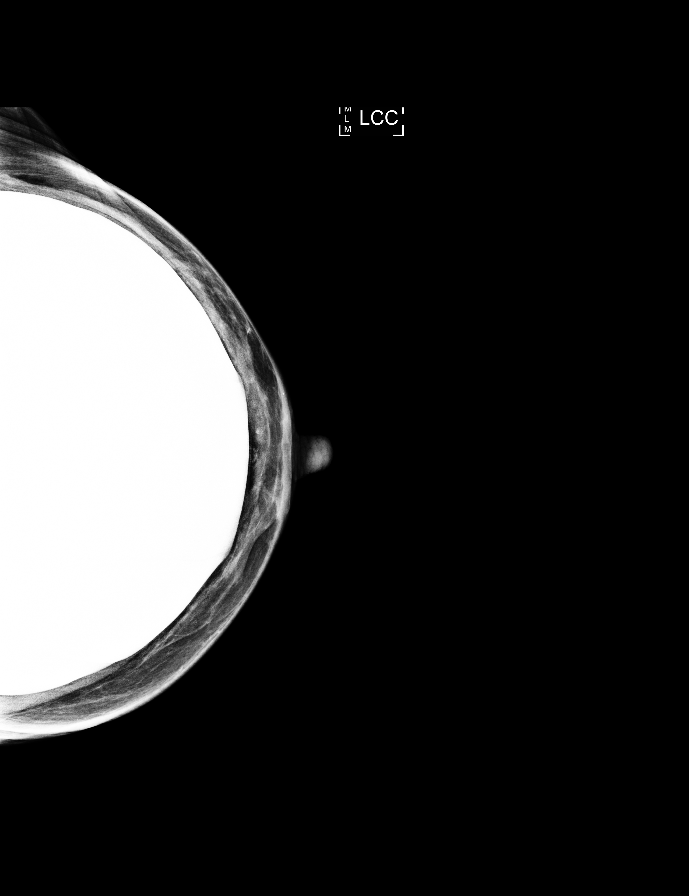

[R CC]
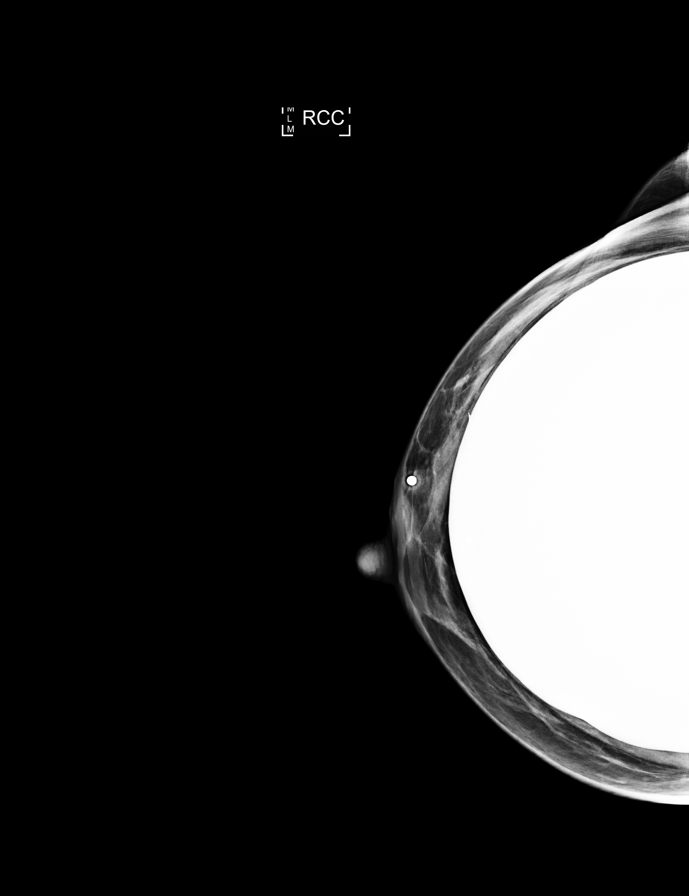

[L MLO synth-2D]
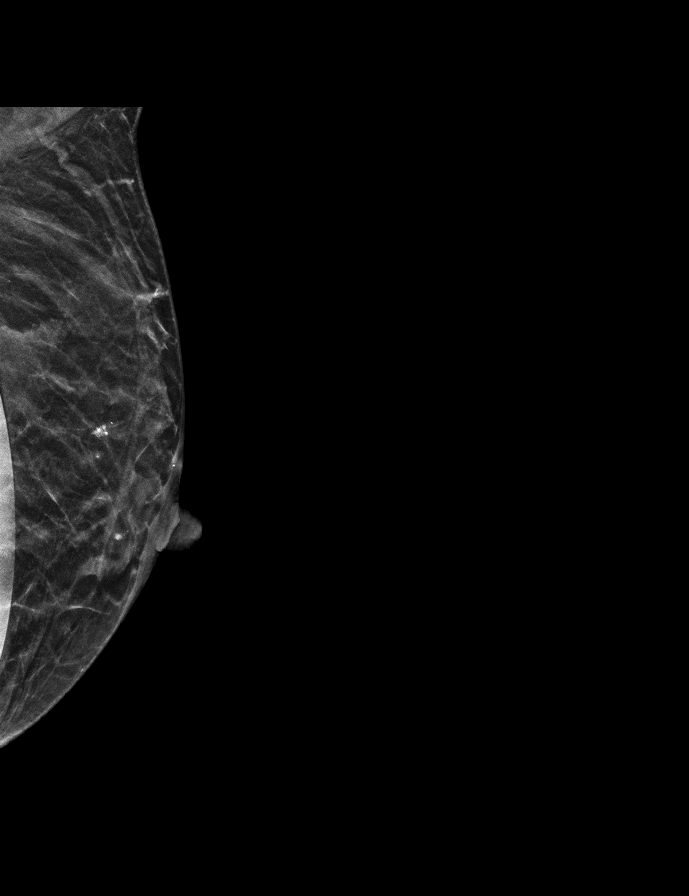

[R TAN synth-2D]
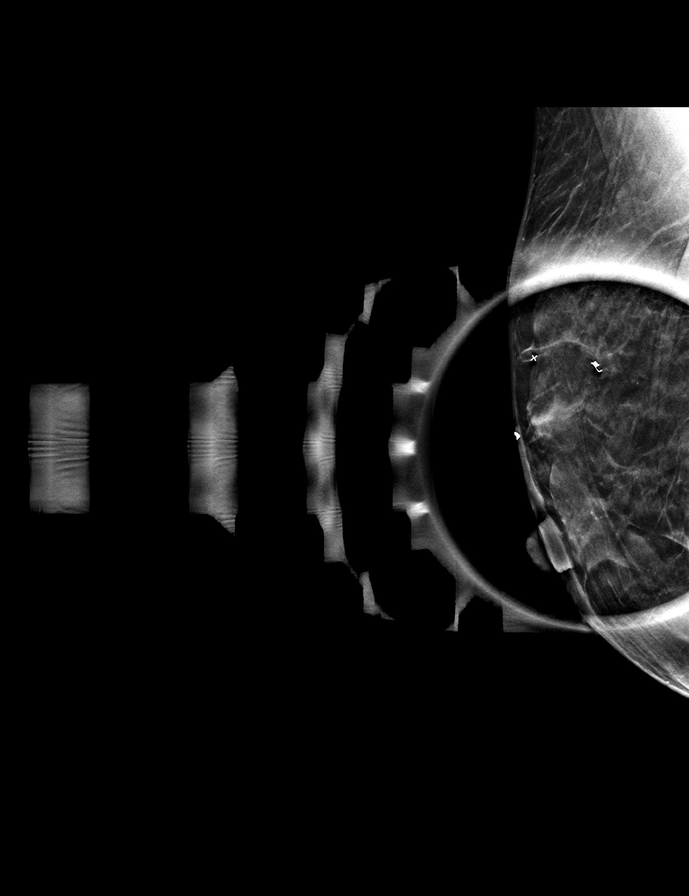

[L CC synth-2D]
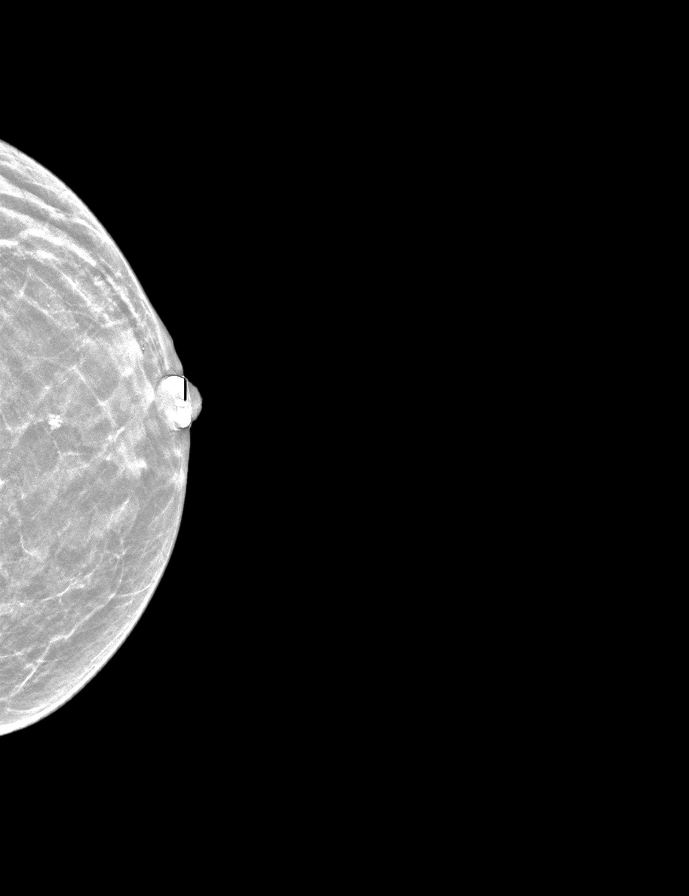

[R MLO synth-2D]
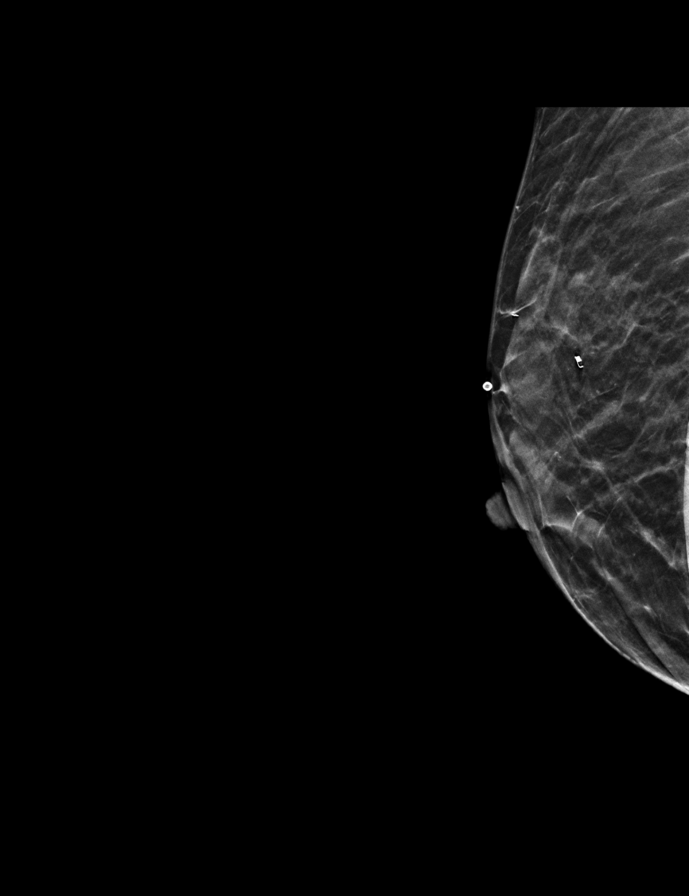

[8 of 34 positions shown; findings below may reference images not displayed]

ACR Breast Density Category c: The breast tissue is heterogeneously
dense, which may obscure small masses.
FINDINGS: There is increased density in the region the patient's palpable lump
best seen on the tangential view. No other suspicious findings are
seen in either breast.

On physical exam, there is a palpable lump in the right breast at 11
o'clock.

Targeted ultrasound is performed, showing an ill-defined hypoechoic
mass at 11 o'clock, 2 cm from the nipple in the region of the
patient's palpable lump measuring 10 x 6 x 11 mm. No axillary
adenopathy on the right.

Mammographic images were processed with CAD.
IMPRESSION: Suspicious mass in the right breast correlating with the patient's
palpable lump. No other evidence of malignancy in either breast.

RECOMMENDATION:
Ultrasound-guided biopsy of the palpable right breast mass at 11
o'clock.

I have discussed the findings and recommendations with the patient.
Results were also provided in writing at the conclusion of the
visit. If applicable, a reminder letter will be sent to the patient
regarding the next appointment.

BI-RADS CATEGORY  4: Suspicious.

## 2021-04-19 ENCOUNTER — Other Ambulatory Visit: Payer: Self-pay | Admitting: Oncology

## 2021-04-19 DIAGNOSIS — Z853 Personal history of malignant neoplasm of breast: Secondary | ICD-10-CM

## 2021-05-20 ENCOUNTER — Ambulatory Visit
Admission: RE | Admit: 2021-05-20 | Discharge: 2021-05-20 | Disposition: A | Discharge status: Home / Self Care | Payer: BC Managed Care – PPO | Source: Ambulatory Visit | Attending: Oncology | Admitting: Oncology

## 2021-05-20 ENCOUNTER — Other Ambulatory Visit: Payer: Self-pay

## 2021-05-20 DIAGNOSIS — Z853 Personal history of malignant neoplasm of breast: Secondary | ICD-10-CM

## 2021-12-21 ENCOUNTER — Ambulatory Visit: Payer: BC Managed Care – PPO | Admitting: Plastic Surgery

## 2021-12-21 ENCOUNTER — Other Ambulatory Visit: Payer: Self-pay

## 2021-12-21 ENCOUNTER — Encounter: Payer: Self-pay | Admitting: Plastic Surgery

## 2021-12-21 VITALS — BP 115/73 | HR 74 | Ht 63.0 in | Wt 101.0 lb

## 2021-12-21 DIAGNOSIS — N6489 Other specified disorders of breast: Secondary | ICD-10-CM

## 2021-12-21 DIAGNOSIS — C50411 Malignant neoplasm of upper-outer quadrant of right female breast: Secondary | ICD-10-CM

## 2021-12-21 DIAGNOSIS — Z170 Estrogen receptor positive status [ER+]: Secondary | ICD-10-CM | POA: Diagnosis not present

## 2021-12-21 NOTE — Progress Notes (Signed)
Patient ID: Ann Mckenzie, female    DOB: December 18, 1971, 51 y.o.   MRN: [id number]   Chief Complaint  Patient presents with   Consult    The patient is a 51 year old female here for evaluation of her breasts.  She had a breast augmentation by Dr. Stephanie Coup in 2010.  She had smooth round moderate Plus [employer]or silicone implants 629 cc on each side.  Then in 2020 she had a right sided breast cancer.  She had a partial mastectomy with sentinel lymph node biopsy done by Dr. Ninfa Linden.  She then had radiation which ended more than a year ago.  She is 5 feet 3 inches tall and weighs 101 pounds.  She had a mammogram in March 2022.  She is concerned about the possibility of a week.  She sees a difference between the left and the right breast.  The left breast is a little softer and squishy year than the right breast which has her concerned.  She has very little to any skin damage from the radiation.  No lumps or bumps of concern.  She likes the size of her implants.   Review of Systems  Constitutional: Negative.   HENT: Negative.    Eyes: Negative.   Respiratory: Negative.    Cardiovascular: Negative.   Gastrointestinal: Negative.   Endocrine: Negative.   Genitourinary: Negative.    Past Medical History:  Diagnosis Date   Eczema    Endometriosis    GERD (gastroesophageal reflux disease)    HSV-1 (herpes simplex virus 1) infection    h/o cold sores   Personal history of radiation therapy 2020    Past Surgical History:  Procedure Laterality Date   AUGMENTATION MAMMAPLASTY Bilateral 2010   BREAST BIOPSY Right 10/02/2017   benign x2   BREAST BIOPSY Right 02/22/2019   malignant   BREAST ENHANCEMENT SURGERY     BREAST LUMPECTOMY Right 03/06/2019   dental implants     HERNIA REPAIR     PARTIAL MASTECTOMY WITH AXILLARY SENTINEL LYMPH NODE BIOPSY Right 03/06/2019   Procedure: RIGHT BREAST PARTIAL MASTECTOMY WITH SENTINEL LYMPH NODE BIOPSY;  Surgeon: Coralie Keens, MD;  Location: [facility];  Service: General;  Laterality: Right;      Current Outpatient Medications:    gabapentin (NEURONTIN) 300 MG capsule, Take 1 capsule (300 mg total) by mouth at bedtime., Disp: 90 capsule, Rfl: 4   tamoxifen (NOLVADEX) 20 MG tablet, Take 1 tablet (20 mg total) by mouth daily., Disp: 30 tablet, Rfl: 3   valACYclovir (VALTREX) 1000 MG tablet, valacyclovir 1 gram tablet, Disp: , Rfl:    Objective:   Vitals:   12/21/21 1439  BP: 115/73  Pulse: 74  SpO2: 100%    Physical Exam Vitals reviewed.  Constitutional:      Appearance: Normal appearance.  HENT:     Head: Normocephalic and atraumatic.  Cardiovascular:     Rate and Rhythm: Normal rate.     Pulses: Normal pulses.  Pulmonary:     Effort: Pulmonary effort is normal. No respiratory distress.  Abdominal:     General: There is no distension.     Palpations: Abdomen is soft.     Tenderness: There is no abdominal tenderness.  Musculoskeletal:        General: Deformity present. No swelling.  Skin:    General: Skin is warm.     Capillary Refill: Capillary refill takes less than 2 seconds.     Coloration: Skin  is not jaundiced.     Findings: No bruising.  Neurological:     Mental Status: She is alert and oriented to person, place, and time.  Psychiatric:        Mood and Affect: Mood normal.        Behavior: Behavior normal.        Thought Content: Thought content normal.        Judgment: Judgment normal.    Assessment & Plan:  Malignant neoplasm of upper-outer quadrant of right breast in female, estrogen receptor positive (HCC)  Postoperative breast asymmetry  Recommend ultrasound to evaluate the integrity of the implants.  If the ultrasound is negative I think that we will give the patient a peace of mind and she will be good for her mammogram in March.  If there is any question then we may need to move to an MRI.  Pictures were obtained of the patient and placed in the chart with the patient's or  guardian's permission.  She will eventually need to have the implants exchanged due to them being 53 years old.   [place], DO

## 2021-12-27 ENCOUNTER — Other Ambulatory Visit: Payer: Self-pay | Admitting: Plastic Surgery

## 2021-12-27 DIAGNOSIS — Z170 Estrogen receptor positive status [ER+]: Secondary | ICD-10-CM

## 2021-12-27 DIAGNOSIS — N6489 Other specified disorders of breast: Secondary | ICD-10-CM

## 2022-01-25 ENCOUNTER — Other Ambulatory Visit: Payer: Self-pay | Admitting: Plastic Surgery

## 2022-01-25 DIAGNOSIS — Z170 Estrogen receptor positive status [ER+]: Secondary | ICD-10-CM

## 2022-01-25 DIAGNOSIS — N6489 Other specified disorders of breast: Secondary | ICD-10-CM

## 2022-01-25 DIAGNOSIS — C50411 Malignant neoplasm of upper-outer quadrant of right female breast: Secondary | ICD-10-CM

## 2022-01-26 ENCOUNTER — Other Ambulatory Visit: Payer: BC Managed Care – PPO

## 2022-06-01 ENCOUNTER — Ambulatory Visit: Payer: BC Managed Care – PPO

## 2022-06-01 ENCOUNTER — Ambulatory Visit
Admission: RE | Admit: 2022-06-01 | Discharge: 2022-06-01 | Disposition: A | Discharge status: Home / Self Care | Payer: BC Managed Care – PPO | Source: Ambulatory Visit | Attending: Plastic Surgery | Admitting: Plastic Surgery

## 2022-06-01 DIAGNOSIS — N6489 Other specified disorders of breast: Secondary | ICD-10-CM

## 2022-06-01 DIAGNOSIS — Z170 Estrogen receptor positive status [ER+]: Secondary | ICD-10-CM

## 2023-04-19 ENCOUNTER — Other Ambulatory Visit: Payer: Self-pay | Admitting: Plastic Surgery

## 2023-04-19 DIAGNOSIS — Z1231 Encounter for screening mammogram for malignant neoplasm of breast: Secondary | ICD-10-CM

## 2023-06-06 ENCOUNTER — Ambulatory Visit
Admission: RE | Admit: 2023-06-06 | Discharge: 2023-06-06 | Disposition: A | Discharge status: Home / Self Care | Payer: BC Managed Care – PPO | Source: Ambulatory Visit | Attending: Plastic Surgery | Admitting: Plastic Surgery

## 2023-06-06 DIAGNOSIS — Z1231 Encounter for screening mammogram for malignant neoplasm of breast: Secondary | ICD-10-CM

## 2023-06-26 ENCOUNTER — Telehealth: Payer: Self-pay | Admitting: *Deleted

## 2023-06-26 NOTE — Telephone Encounter (Signed)
I spoke with Ms. Ann Mckenzie. She had received her results by mail and had no questions

## 2023-06-26 NOTE — Telephone Encounter (Signed)
Left VM for Ann Mckenzie to call the office at her convenience

## 2023-06-26 NOTE — Telephone Encounter (Signed)
-----   Message from Peggye Form, DO sent at 06/26/2023  8:17 AM EDT ----- Please be sure pt knows ----- Message ----- From: Interface, Rad Results In Sent: 06/08/2023  10:01 AM EDT To: Alena Bills [place], DO

## 2023-06-26 NOTE — Telephone Encounter (Signed)
Patient called back and left vm because she missed call and asked for a returned call

## 2024-04-23 ENCOUNTER — Other Ambulatory Visit: Payer: Self-pay | Admitting: Obstetrics and Gynecology

## 2024-04-23 DIAGNOSIS — Z1231 Encounter for screening mammogram for malignant neoplasm of breast: Secondary | ICD-10-CM

## 2024-06-07 ENCOUNTER — Ambulatory Visit
Admission: RE | Admit: 2024-06-07 | Discharge: 2024-06-07 | Disposition: A | Discharge status: Home / Self Care | Source: Ambulatory Visit | Attending: Obstetrics and Gynecology | Admitting: Obstetrics and Gynecology

## 2024-06-07 DIAGNOSIS — Z1231 Encounter for screening mammogram for malignant neoplasm of breast: Secondary | ICD-10-CM
